# Patient Record
Sex: Male | Born: 1955 | Race: Black or African American | Hispanic: No | Marital: Married | Smoking: Current every day smoker
Health system: Southern US, Community
[De-identification: ages and names within clinical notes are randomized; demographics above are authoritative.]

## PROBLEM LIST (undated history)

## (undated) DIAGNOSIS — I219 Acute myocardial infarction, unspecified: Secondary | ICD-10-CM

## (undated) DIAGNOSIS — I10 Essential (primary) hypertension: Secondary | ICD-10-CM

## (undated) DIAGNOSIS — I2510 Atherosclerotic heart disease of native coronary artery without angina pectoris: Secondary | ICD-10-CM

## (undated) HISTORY — PX: CORONARY ANGIOPLASTY WITH STENT PLACEMENT: SHX49

---

## 2001-12-28 ENCOUNTER — Ambulatory Visit (HOSPITAL_COMMUNITY): Admission: RE | Admit: 2001-12-28 | Discharge: 2001-12-28 | Payer: Self-pay | Admitting: *Deleted

## 2001-12-28 ENCOUNTER — Encounter: Payer: Self-pay | Admitting: Family Medicine

## 2005-09-19 ENCOUNTER — Ambulatory Visit (HOSPITAL_COMMUNITY): Admission: RE | Admit: 2005-09-19 | Discharge: 2005-09-19 | Payer: Self-pay | Admitting: Family Medicine

## 2007-06-24 ENCOUNTER — Inpatient Hospital Stay (HOSPITAL_COMMUNITY): Admission: EM | Admit: 2007-06-24 | Discharge: 2007-06-28 | Payer: Self-pay | Admitting: Emergency Medicine

## 2007-07-20 ENCOUNTER — Encounter (HOSPITAL_COMMUNITY): Admission: RE | Admit: 2007-07-20 | Discharge: 2007-08-03 | Payer: Self-pay | Admitting: Cardiology

## 2007-08-06 ENCOUNTER — Encounter (HOSPITAL_COMMUNITY): Admission: RE | Admit: 2007-08-06 | Discharge: 2007-09-05 | Payer: Self-pay | Admitting: Cardiology

## 2009-09-28 ENCOUNTER — Encounter (HOSPITAL_COMMUNITY): Admission: RE | Admit: 2009-09-28 | Discharge: 2009-11-02 | Payer: Self-pay | Admitting: Cardiology

## 2009-12-04 ENCOUNTER — Ambulatory Visit (HOSPITAL_COMMUNITY): Admission: RE | Admit: 2009-12-04 | Discharge: 2009-12-04 | Payer: Self-pay | Admitting: Family Medicine

## 2010-11-24 ENCOUNTER — Encounter: Payer: Self-pay | Admitting: Family Medicine

## 2011-01-10 ENCOUNTER — Emergency Department (HOSPITAL_COMMUNITY): Payer: Managed Care, Other (non HMO)

## 2011-01-10 ENCOUNTER — Emergency Department (HOSPITAL_COMMUNITY)
Admission: EM | Admit: 2011-01-10 | Discharge: 2011-01-11 | Disposition: A | Discharge status: Other Acute Inpatient Hospital | Payer: Managed Care, Other (non HMO) | Attending: Emergency Medicine | Admitting: Emergency Medicine

## 2011-01-10 DIAGNOSIS — I200 Unstable angina: Secondary | ICD-10-CM | POA: Insufficient documentation

## 2011-01-10 DIAGNOSIS — R0602 Shortness of breath: Secondary | ICD-10-CM | POA: Insufficient documentation

## 2011-01-10 DIAGNOSIS — I10 Essential (primary) hypertension: Secondary | ICD-10-CM | POA: Insufficient documentation

## 2011-01-10 DIAGNOSIS — R209 Unspecified disturbances of skin sensation: Secondary | ICD-10-CM | POA: Insufficient documentation

## 2011-01-10 DIAGNOSIS — R079 Chest pain, unspecified: Secondary | ICD-10-CM | POA: Insufficient documentation

## 2011-01-10 LAB — BASIC METABOLIC PANEL
GFR calc non Af Amer: >60 mL/min (ref >60)
Glucose, Bld: 194 mg/dL — ABNORMAL HIGH (ref 70–99)
Potassium: 3.6 mEq/L (ref 3.5–5.1)
Sodium: 132 mEq/L — ABNORMAL LOW (ref 135–145)

## 2011-01-10 LAB — DIFFERENTIAL
Basophils Absolute: 0 10*3/uL (ref 0.0–0.1)
Lymphocytes Relative: 42 % (ref 12–46)
Neutro Abs: 5 10*3/uL (ref 1.7–7.7)
Neutrophils Relative %: 47 % (ref 43–77)

## 2011-01-10 LAB — CBC
HCT: 43.1 % (ref 39.0–52.0)
Hemoglobin: 14.4 g/dL (ref 13.0–17.0)
RDW: 14.8 % (ref 11.5–15.5)
WBC: 10.8 10*3/uL — ABNORMAL HIGH (ref 4.0–10.5)

## 2011-01-10 LAB — POCT CARDIAC MARKERS
CKMB, poc: 1.6 ng/mL (ref 1.0–8.0)
Troponin i, poc: <0.05 ng/mL (ref 0.00–0.09)

## 2011-01-11 ENCOUNTER — Inpatient Hospital Stay (HOSPITAL_COMMUNITY)
Admission: EM | Admit: 2011-01-11 | Discharge: 2011-01-14 | DRG: 247 | Disposition: A | Discharge status: Home / Self Care | Payer: Managed Care, Other (non HMO) | Source: Other Acute Inpatient Hospital | Attending: Cardiology | Admitting: Cardiology

## 2011-01-11 DIAGNOSIS — Z7982 Long term (current) use of aspirin: Secondary | ICD-10-CM

## 2011-01-11 DIAGNOSIS — E785 Hyperlipidemia, unspecified: Secondary | ICD-10-CM | POA: Diagnosis present

## 2011-01-11 DIAGNOSIS — Z87891 Personal history of nicotine dependence: Secondary | ICD-10-CM

## 2011-01-11 DIAGNOSIS — I10 Essential (primary) hypertension: Secondary | ICD-10-CM | POA: Diagnosis present

## 2011-01-11 DIAGNOSIS — E119 Type 2 diabetes mellitus without complications: Secondary | ICD-10-CM | POA: Diagnosis present

## 2011-01-11 DIAGNOSIS — E7800 Pure hypercholesterolemia, unspecified: Secondary | ICD-10-CM | POA: Diagnosis present

## 2011-01-11 DIAGNOSIS — I200 Unstable angina: Secondary | ICD-10-CM | POA: Diagnosis present

## 2011-01-11 DIAGNOSIS — I2510 Atherosclerotic heart disease of native coronary artery without angina pectoris: Principal | ICD-10-CM | POA: Diagnosis present

## 2011-01-11 DIAGNOSIS — I252 Old myocardial infarction: Secondary | ICD-10-CM

## 2011-01-11 DIAGNOSIS — Z9861 Coronary angioplasty status: Secondary | ICD-10-CM

## 2011-01-11 LAB — LIPID PANEL
Cholesterol: 186 mg/dL (ref 0–200)
HDL: 33 mg/dL — ABNORMAL LOW (ref >39)
LDL Cholesterol: 129 mg/dL — ABNORMAL HIGH (ref 0–99)
Total CHOL/HDL Ratio: 5.6 RATIO
Triglycerides: 119 mg/dL (ref <150)

## 2011-01-11 LAB — CARDIAC PANEL(CRET KIN+CKTOT+MB+TROPI)
CK, MB: 3.8 ng/mL (ref 0.3–4.0)
Relative Index: 1 (ref 0.0–2.5)
Total CK: 299 U/L — ABNORMAL HIGH (ref 7–232)
Total CK: 302 U/L — ABNORMAL HIGH (ref 7–232)
Troponin I: 0.06 ng/mL (ref 0.00–0.06)
Troponin I: 0.1 ng/mL — ABNORMAL HIGH (ref 0.00–0.06)

## 2011-01-11 LAB — GLUCOSE, CAPILLARY: Glucose-Capillary: 170 mg/dL — ABNORMAL HIGH (ref 70–99)

## 2011-01-11 LAB — HEPARIN LEVEL (UNFRACTIONATED): Heparin Unfractionated: 0.58 IU/mL (ref 0.30–0.70)

## 2011-01-12 ENCOUNTER — Inpatient Hospital Stay (HOSPITAL_COMMUNITY): Payer: Managed Care, Other (non HMO)

## 2011-01-12 LAB — CBC
HCT: 40.3 % (ref 39.0–52.0)
Hemoglobin: 13.8 g/dL (ref 13.0–17.0)
MCV: 81.4 fL (ref 78.0–100.0)
RBC: 4.95 MIL/uL (ref 4.22–5.81)
WBC: 10.4 10*3/uL (ref 4.0–10.5)

## 2011-01-12 LAB — GLUCOSE, CAPILLARY: Glucose-Capillary: 166 mg/dL — ABNORMAL HIGH (ref 70–99)

## 2011-01-12 MED ORDER — TECHNETIUM TC 99M TETROFOSMIN IV KIT
30.0000 | PACK | Freq: Once | INTRAVENOUS | Status: AC | PRN
  Administered 2011-01-12: 30 via INTRAVENOUS

## 2011-01-12 MED ORDER — TECHNETIUM TC 99M TETROFOSMIN IV KIT
10.0000 | PACK | Freq: Once | INTRAVENOUS | Status: AC | PRN
  Administered 2011-01-12: 10 via INTRAVENOUS

## 2011-01-13 ENCOUNTER — Other Ambulatory Visit (HOSPITAL_COMMUNITY): Payer: Self-pay

## 2011-01-13 LAB — PLATELET INHIBITION P2Y12
P2Y12 % Inhibition: 6 %
Platelet Function  P2Y12: 281 [PRU] (ref 194–418)
Platelet Function Baseline: 299 [PRU] (ref 194–418)

## 2011-01-13 LAB — CBC
HCT: 40.7 % (ref 39.0–52.0)
Hemoglobin: 13.6 g/dL (ref 13.0–17.0)
MCH: 27.3 pg (ref 26.0–34.0)
MCHC: 33.4 g/dL (ref 30.0–36.0)
MCV: 81.6 fL (ref 78.0–100.0)
RBC: 4.99 MIL/uL (ref 4.22–5.81)

## 2011-01-13 LAB — GLUCOSE, CAPILLARY
Glucose-Capillary: 136 mg/dL — ABNORMAL HIGH (ref 70–99)
Glucose-Capillary: 121 mg/dL — ABNORMAL HIGH (ref 70–99)
Glucose-Capillary: 126 mg/dL — ABNORMAL HIGH (ref 70–99)
Glucose-Capillary: 178 mg/dL — ABNORMAL HIGH (ref 70–99)

## 2011-01-14 LAB — CBC
Hemoglobin: 12.4 g/dL — ABNORMAL LOW (ref 13.0–17.0)
MCH: 26.6 pg (ref 26.0–34.0)
Platelets: 242 10*3/uL (ref 150–400)
RBC: 4.67 MIL/uL (ref 4.22–5.81)
WBC: 8.7 10*3/uL (ref 4.0–10.5)

## 2011-01-14 LAB — GLUCOSE, CAPILLARY: Glucose-Capillary: 118 mg/dL — ABNORMAL HIGH (ref 70–99)

## 2011-01-14 LAB — CK TOTAL AND CKMB (NOT AT ARMC)
CK, MB: 3.2 ng/mL (ref 0.3–4.0)
Relative Index: 1 (ref 0.0–2.5)
Total CK: 314 U/L — ABNORMAL HIGH (ref 7–232)

## 2011-01-14 LAB — BASIC METABOLIC PANEL
Calcium: 8.7 mg/dL (ref 8.4–10.5)
Chloride: 106 mEq/L (ref 96–112)
Creatinine, Ser: 0.99 mg/dL (ref 0.4–1.5)
GFR calc Af Amer: >60 mL/min (ref >60)
Sodium: 134 mEq/L — ABNORMAL LOW (ref 135–145)

## 2011-02-06 NOTE — Discharge Summary (Signed)
[name], [name]              ACCOUNT NO.:  [id number]  MEDICAL RECORD NO.:  [id number]           PATIENT TYPE:  I  LOCATION:  6526                         FACILITY:  [facility]  PHYSICIAN:  [name], M.D. DATE OF BIRTH:  July 14, 1956  DATE OF ADMISSION:  01/11/2011 DATE OF DISCHARGE:  01/14/2011                              DISCHARGE SUMMARY   ADMITTING DIAGNOSES:  Unstable angina, rule out myocardial infarction, coronary artery disease, history of high lateral wall myocardial infarction in the past, hypertension, diabetes mellitus.  DISCHARGE DIAGNOSES:  Status post unstable angina, status post PTCA stenting to posterolateral branch of RCA, coronary artery disease, history of high lateral wall myocardial infarction in the past, hypertension, hypercholesteremia, non-insulin-dependent diabetes mellitus, history of tobacco abuse.  DISCHARGE MEDICATIONS: 1. Enteric-coated aspirin 325 mg 1 tablet daily. 2. Prasugrel 10 mg 1 tablet daily. 3. Crestor 20 mg 1 tablet daily. 4. Metoprolol XL 50 mg 1 tablet every morning. 5. Exforge 10/320 mg 1 tablet daily. 6. Tekturna HCT 1 tablet daily. 7. Metformin XR 500 mg 1 tablet twice daily starting from January 15, 2011. 8. Amaryl 4 mg 1 tablet twice daily. 9. Nitrostat 0.4 mg sublingual q. 5 minutes as needed.  DIET:  Low salt, low cholesterol, 1800 calories ADA diet.  The patient has been advised to reduce weight.  The patient also has been advised to refrain from smoking.  Post PTCA stent instructions have been given.  The patient will be scheduled for phase II cardiac rehab as outpatient.  CONDITION AT DISCHARGE:  Stable.  BRIEF HISTORY AND HOSPITAL COURSE:  [name] is a 55 year old black male with past medical history significant for coronary artery disease, history of high lateral wall MI in 2008, subsequently had PTCA stenting to diagonal 2, hypertension, non-insulin-dependent diabetes mellitus, hypercholesteremia,  morbid obesity, tobacco abuse was admitted by Dr. Algie [name] on January 11, 2011, because of recurrent chest pain.  The patient initially went to [facility], had negative cardiac workup, but because of recurrent chest pain, was referred to [facility] for further evaluation and treatment.  PAST MEDICAL HISTORY:  As above.  MEDICATION AT HOME:  He is on aspirin, Plavix, Lopressor, Lipitor, Norvasc, Nitrostat.  SOCIAL HISTORY:  He is married, has four children.  Smoked one-pack per day for 30+ years.  No history of alcohol abuse.  He lives with oldest daughter.  ALLERGIES:  No known drug allergies.  FAMILY HISTORY:  Positive for coronary artery disease.  PHYSICAL EXAMINATION:  GENERAL:  He is alert, awake, and oriented x3, in no acute distress. VITAL SIGNS:  Blood pressure was 142/83, pulse was 87 and regular. HEENT:  Conjunctivae were pink. NECK:  Supple.  No JVD. LUNGS:  Clear to auscultation without rhonchi or rales. CARDIOVASCULAR:  S1 and S2 were normal. EXTREMITIES:  There is no clubbing, cyanosis, or edema. NEURO:  Grossly intact.  LABORATORY DATA:  His three sets of cardiac enzymes were negative. Hemoglobin was 14.4, hematocrit 43.1, white count of 10.8.  His cholesterol was 186, LDL was 129, HDL 33, triglycerides were 119, hemoglobin A1c was 8.3.  His  platelet P2Y12 percent inhibition was 6%. PRU value was also elevated to 281.  His Plavix has been changed to Effient.  The patient did get bolus of 60 mg today and will be started on 10 mg every day.  Post PCI, his CK is 314, MB of 3.2, relative index of 1.0.  Today's hemoglobin is 12.4, hematocrit 37.7, white count of 8.7, platelet count is 242,000.  His sodium was 4.2, BUN 13, creatinine 0.99, glucose is 113.  BRIEF HOSPITAL COURSE:  The patient was admitted to telemetry unit.  MI was ruled out by serial enzymes and EKG.  The patient subsequently underwent Persantine Myoview, which showed reversible ischemia in  the apical lateral wall with EF of 52%.  The patient subsequently underwent left cath and PTCA stenting to posterolateral branch of RCA.  As per procedure report, the patient tolerated procedure well.  There were no complications.  Postprocedure, the patient did not have any episodes of chest pain during the hospital stay.  His groin is stable with no evidence of hematoma or bruit.  Phase I cardiac rehab was called.  The patient ambulated earlier this morning without any problems.  His groin is stable.  The patient will be discharged home on above medications, and will be followed up in my office in 1 week.  The patient will be scheduled for phase II cardiac rehab as outpatient.     [name]. [name], M.D.     MNH/MEDQ  D:  01/14/2011  T:  01/15/2011  Job:  [id number]  Electronically Signed by [name] M.D. on 02/06/2011 10:29:44 PM

## 2011-02-06 NOTE — Procedures (Signed)
NAMEJERELD, [name]              ACCOUNT NO.:  [id number]  MEDICAL RECORD NO.:  [id number]           PATIENT TYPE:  I  LOCATION:  6526                         FACILITY:  [facility]  PHYSICIAN:  Raneem Mendolia N. Sharyn Lull, M.D. DATE OF BIRTH:  [birth year]  DATE OF PROCEDURE:  01/13/2011 DATE OF DISCHARGE:  01/14/2011                           CARDIAC CATHETERIZATION   SURGEON:  [name]. Sharyn Lull, MD  PROCEDURES: 1. Left cardiac catheterization with selective left and right coronary     angiography, left ventriculography via right groin using Judkins     technique. 2. Successful percutaneous transluminal coronary angioplasty to     posterior left ventricular branch using 2.5 x 12 mm long Trek     balloon. 3. Successful deployment of 2.5 x 16 mm long Promus Element drug-     eluting stent in posterior left ventricular branch of right     coronary artery. 4. Successful postdilatation of this stent using 2.75 x 12 mm long Halbur     Trek balloon.  INDICATIONS FOR PROCEDURE:  [name] is a 55 year old black male with past medical history significant for coronary artery disease, history of high lateral wall MI in the past, hypertension, GERD, degenerative joint disease, and tobacco abuse who was admitted by Dr. Algie Coffer on January 11, 2011, because of recurrent chest pain.  The patient initially went to [facility], had negative cardiac workup there, and due to recurrent chest pain was transferred to [facility] for further evaluation and treatment.  The patient was admitted to Telemetry Unit. MI was ruled out by serial enzymes and EKG.  The patient subsequently underwent Persantine Myoview which showed reversible defect involving the apical lateral wall with EF of 52%.  Due to typical anginal chest pain and positive Persantine Myoview and multiple risk factors, I discussed with the patient regarding left cath, possible PTCA and stenting, its risks and benefits, i.e., death, MI, stroke,  need for emergency CABG, local vascular complications, risk of restenosis, etc., and consented for the procedure.  PROCEDURE:  After obtaining informed consent, the patient was brought to the Cath Lab and was placed on fluoroscopy table.  Right groin was prepped and draped in the usual fashion.  Xylocaine 1% was used for local anesthesia in the right groin.  With the help of thin-wall needle, a 6-French arterial sheath was placed.  The sheath was aspirated and flushed.  Next, a 6-French left Judkins catheter was advanced over the wire under fluoroscopic guidance up to the ascending aorta.  Wire was pulled out.  The catheter was aspirated and connected to the manifold. Catheter was further advanced and engaged into left coronary ostium. Multiple views of the left system were taken.  Next, the catheter was disengaged and was pulled out over the wire and was replaced with 6- [place] right Judkins catheter which was advanced over the wire under fluoroscopic guidance up to the ascending aorta.  Wire was pulled out. The catheter was aspirated and connected to the manifold.  Catheter was further advanced and engaged into right coronary ostium.  Multiple views of the right system were taken.  Next, the  catheter was disengaged and was pulled out over the wire and was replaced with 6-French pigtail catheter which was advanced over the wire under fluoroscopic guidance up to the ascending aorta.  Wire was pulled out.  The catheter was aspirated and connected to the manifold.  Catheter was further advanced across the aortic valve into the LV.  LV pressures were recorded.  Next, LV-graphy was done in 30-degree RAO position.  Post-angiographic pressures were recorded from LV and then pullback pressures were recorded from aorta.  There was no gradient across the aortic valve. Next, pigtail catheter was pulled out over the wire.  Sheaths were aspirated and flushed.  FINDINGS:  LV showed mild  inferoapical wall hypokinesia, LVH.  There was 3+ MR, EF of 50% approximately.  Left main was patent.  LAD has 10-15% ostial stenosis.  Diagonal 1 is very small.  Diagonal 2 has 50-60% ostial stenosis.  Stented proximal portion is patent.  Left circumflex has 20-25% proximal stenosis.  OM-1 is small which is long.  OM-2 and OM- 3 were very, very small.  OM-4 was small which was patent.  RCA has 20- 25% proximal sequential stenosis and 50-60% mid focal stenosis.  PDA is patent.  PLV branch has focal mid 90-95% stenosis.  INTERVENTIONAL PROCEDURE:  Successful PTCA to PLV branch of RCA was done using 2.5 x 12 mm long Trek balloon for predilatation and then 2.5 x 16 mm long Promus Element drug-eluting stent was deployed at 13 atmospheric pressure in PLV branch.  Stent was postdilated using 2.75 x 12 mm long Henry Trek balloon going up to 18 atmospheric pressure.  Lesion was dilated from 90-95% to 0% residual with excellent TIMI grade 3 distal flow without evidence of dissection or distal embolization.  The patient received weight-based Angiomax and 600 mg of Plavix during the procedure.  The patient tolerated the procedure well.  There were no complications.  The patient was transferred to the recovery room in stable condition.     [name]. Sharyn Lull, M.D.     MNH/MEDQ  D:  01/14/2011  T:  01/15/2011  Job:  [id number]  Electronically Signed by Rinaldo Cloud M.D. on 02/06/2011 10:29:49 PM

## 2011-03-18 NOTE — Cardiovascular Report (Signed)
[name]              ACCOUNT NO.:  [id number]   MEDICAL RECORD NO.:  [id number]          PATIENT TYPE:  INP   LOCATION:  2905                         FACILITY:  [facility]   PHYSICIAN:  [name]. Sharyn Lull, M.D. DATE OF BIRTH:  1956-05-23   DATE OF PROCEDURE:  06/24/2007  DATE OF DISCHARGE:                            CARDIAC CATHETERIZATION   PROCEDURES:  1. Left cardiac catheterization with selective left and right coronary      angiography, left ventriculography via right groin using Judkins      technique.  2. Successful percutaneous transluminal coronary angiography to      proximal 100% occluded diagonal-2 using 2.5 mg to point using 1.58      x 12-mm long, Voyager balloon and 2.5 x 8-mm long, Voyager balloon.  3. Successful deployment of 2.5 x 18-mm long, Cypher drug-eluting      stent.  4. Successful post dilatation of Cypher drug-eluting stent using 2.75      x 8-mm long PowerSail balloon.   INDICATIONS FOR PROCEDURE:  [name] is a 55 year old, black male with  past medical history significant for hypertension, GERD, degenerative  joint disease, tobacco abuse and complains of recurrent retrosternal  chest pain tightness since Sunday off and on lasting 2-3 hours.  Did not  seek any medical attention as he thought this was due to acid reflux  until Wednesday when he saw his PMD when he had vague abdominal pain and  was treated for GERD.  He was also noted to have elevated blood pressure  and was referred to Dr. Magda Kiel office for elevated blood pressure and  vague abdominal pain.  EKG done at Dr. Magda Kiel office showed Q waves  in I and aVL with ST elevation and focal ST depression in inferior and  lateral leads and also Q-wave in septal leads.  The patient refused to  come to the ER and as he has to go back to school and then drove back to  the ER later in the evening complaining of feeling weak and short of  breath and vague upper abdominal pain.  EKG done in  the ER showed  similar changes as above, but minimal improvement in R-wave progression  with normalization of R-wave progression in V1-V3.  Due to typical EKG  changes, vague upper abdominal and chest pain, the patient was also  noted to have elevated CPK-MB and troponin I suggestive of recent high  lateral MI.  Discussed with the patient at length regarding emergency  left catheterization with possible PTCA stenting, its risks and  benefits, i.e. death, MI, stroke, need for emergency CABG, risk of  restenosis, local vascular complications, accepted and consented for the  procedure.   PROCEDURE:  After obtaining the informed consent, the patient was  brought to the catheterization lab and was placed on fluoroscopy table.  The right groin was prepped and draped in usual fashion.  Xylocaine 2%  was used for local anesthesia in the right groin.  With the help of thin-  wall needle, a 6-French arterial sheath was placed.  The sheath was  aspirated and flushed.  Next, 6-French left Judkins catheter was  advanced over the wire under fluoroscopic guidance up to the ascending  aorta.  Wire was pulled out.  The catheter was aspirated and connected  to the manifold.  Catheter was further advanced and engaged into the  left coronary ostium.  Multiple views of the left system were taken.  The catheter was disengaged and was pulled out over the wire.  It was  replaced with a 6-French, right Judkins catheter which was advanced over  the wire under fluoroscopic guidance up to the ascending aorta.  Wire  was pulled out, the catheter was aspirated and connected to the  manifold.  Catheter was further advanced and engaged into right coronary  ostium.  Multiple views of the right system were taken.  Next, the  catheter was disengaged and was pulled out over the wire and was  replaced with 6-French pigtail catheter which was advanced over the wire  under fluoroscopic guidance up to the ascending aorta.  The  wire was  pulled out,  the catheter was aspirated and connected to the manifold.  Catheter was further advanced across the aortic valve into the LV.  LV  pressures were recorded.  Next, LV graft was done in 30-degree RAO  position.  Post angiographic pressures were recorded from LV and then  pullback pressures were recorded from the aorta.  There was no gradient  across the aortic valve.  Next, the pigtail catheter was pulled out over  the wire.  Sheaths were aspirated and flushed.   FINDINGS:  LV showed anterolateral wall hypokinesia, EF of 45-50%.  Left  main was patent.  LAD has 15-20% proximal and distal stenosis.  Diagonal-  1 is small which is patent.  Diagonal-2 was 100% occluded at the ostium.  Left circumflex at 10-15% mid stenosis.  OM-1 and OM-2 were mild at 10-  15% stenosis.  RCA was large which has 10-15% mid stenosis.  PDA was  patent.  PLV branches were patent.   Interventional procedure with successful PTCA to proximal 100% occluded  diagonal-2 was done using 2.5 x 8-mm long, Voyager balloon and then 1.5  x 12-mm long, Voyager balloon for predilatation and then 2.5 x 18-mm  long, Cypher drug-eluting stent was deployed in proximal diagonal-2 at  15 atmospheres of pressure.  Stent was postdilated using 2.75 x 8-mm  long, PowerSail balloon going up to 18 atmospheres pressure.  Lesion was  dilated from 100% to 0% with excellent TIMI-3 distal flow without  evidence of dissection or distal embolization.  The patient received  weight-based Angiomax and 600 mg of Plavix during the procedure.  The  patient tolerated procedure well.  There are no complications.  The  patient was transferred to recovery room.  The patient was transferred  to CCU in stable condition.      [name]. Sharyn Lull, M.D.  Electronically Signed     MNH/MEDQ  D:  06/25/2007  T:  06/26/2007  Job:  [id number]   cc:   Osvaldo Shipper. Spruill, M.D.  Catheterization Lab

## 2011-03-18 NOTE — Discharge Summary (Signed)
[name], [name] NO.:  [id number]   MEDICAL RECORD NO.:  [id number]          PATIENT TYPE:  INP   LOCATION:  2031                         FACILITY:  [facility]   PHYSICIAN:  [name], M.D. DATE OF BIRTH:  August 25, 1956   DATE OF ADMISSION:  06/24/2007  DATE OF DISCHARGE:  06/28/2007                               DISCHARGE SUMMARY   ADMISSION DIAGNOSES:  1. Recent high lateral acute myocardial infarction.  2. Post infarct atypical chest pain.  3. Uncontrolled hypertension.  4. Tobacco abuse.  5. Gastroesophageal reflux disease.  6. Degenerative joint disease.  7. Positive family history of coronary artery disease.   DISCHARGE DIAGNOSIS:  1. Status post recent acute high lateral myocardial infarction; status      post  infarct angina; status post percutaneous coronary      intervention to 100% occluded diagonal #2.  2. Hypertension.  3. Hypercholesterolemia.  4. History of tobacco abuse.  5. Gastroesophageal reflux disease.  6. Degenerative joint disease.  7. Positive family history of coronary artery disease.  8. Glucose intolerance.   MEDICATIONS ON DISCHARGE:  1. Enteric coated aspirin 325 mg one tablet daily.  2. Plavix 75 mg one tablet daily with food.  3. Lopressor 100 mg one tablet twice daily.  4. Diovan 320 mg one tablet daily.  5. Lotensin 10 mg one tablet daily.  6. Lipitor 80 mg one tablet daily.  7. Norvasc 5 mg one tablet twice daily.  8. Nitrostat 0.4 mg sublingual __________ .   DISCHARGE DIET:  Low salt, low cholesterol.   DISCHARGE ACTIVITIES:  Increase activities slowly.  Post PTCA and stent  instructions have been given.  Follow up with me in one week; with Dr.  Shana [name] and Dr. Lowella [name] as scheduled.   CONDITION ON DISCHARGE:  Stable.   BRIEF HISTORY:  [name] is a 54 year old black male with a past  medical history significant for hypertension; GERD; tobacco abuse.  He  complained of recurrent retrosternal chest pressure  and  tightness since  Sunday off-and-on, lasting 2-3 hours, __________ when he saw his PMD for  __________ abdominal pain and was treated for GERD and __________ .  EKG  done at [name] office revealed __________ leads I and aVL with ST  elevation __________ depression in the inferolateral leads and Q in the  __________ leads.  At first he refused to go the ER, as he had to go to  school __________ he came to the ER because of feeling weak and short of  breath __________ at that time.  __________  done in the ER showed  __________ with improvement of __________ , but persistent Q in I, aVL  __________ .  Also the patient was noted to have __________ .   PAST MEDICAL HISTORY:  As above.  Plus __________ .  He is on Nexium;  __________ ; aspirin.   SOCIAL HISTORY:  He is married, four children.  He is studying for  __________ .  He smokes one pack per day for 30+ years.  No history of  alcohol abuse.   FAMILY  HISTORY:  Family history is positive for coronary artery disease.   PHYSICAL EXAMINATION:  GENERAL APPEARANCE:  On examination, he was alert  and oriented x3, in no acute distress.  VITAL SIGNS:  Blood pressure was 163/102, pulse was 63.  __________ .  NECK:  Supple, no JVD.  LUNGS:  Lungs were clear to auscultation  __________  CARDIAC:  __________ .   LABORATORY INVESTIGATIONS:  His cholesterol was 221, LDL 172, HDL 29,  triglycerides 102.  His first set of cardiac enzymes:  CK 866, MB 42.5,  relative index 4.9.  The second set CK was 984, MB 62.7, relative index  6.4.  The third set CK was 744, MB 39.1, relative index 5.3.  The fourth  set CK was 468, MB 13, relative index 2.8.  On August 24, CK is 440, MB  5.3, his troponin I was 5.74, 32.22, 11.82, 8.21, 6.64, and today is  3.34.   His other labs:  Sodium was 133, potassium 3.8, chloride 103,  bicarbonate 24, glucose 139, BUN 8, creatinine 1.38.  Repeat  electrolytes on August 24:  Sodium 139, potassium 3.7,  chloride 106,  bicarbonate 27, glucose 108, BUN 12, creatinine 1.31.  Hemoglobin was  15.5, hematocrit 46.5, white count of 14.1.  On August 24, hemoglobin is  12.8, hematocrit 38.8, white count  of 9.8.  Today, hemoglobin is 13.1,  hematocrit 38.6, white count of 8.8 which has been stable.   HOSPITAL COURSE:  The patient was directly taken to the catheterization  lab and underwent emergency left catheterization and PTCA and stenting  to a 100% occluded diagonal #2.  As per procedure report the patient  tolerated the procedure well and there were no complications.  The  patient did not have any episodes of chest pain during the hospital  stay.  His groin is stable with no evidence of hematoma or bruit.  The  patient has been ambulating in the hallway without any problems.  Phase  I cardiac rehab was called.  The patient ambulated in the  hallway without any chest pains.  The patient refused phase II cardiac  rehab.  The patient will be discharged home on the above medications and  will be followed up in my office in one week.  The patient also has been  advised to refrain from sweets.  Will check his hemoglobin A1c as an  outpatient.      [name], M.D.  Electronically Signed     MNH/MEDQ  D:  06/28/2007  T:  06/28/2007  Job:  [id number]   cc:   [name], M.D.  Luis Medal, MD

## 2011-08-15 LAB — URINE MICROSCOPIC-ADD ON

## 2011-08-15 LAB — DIFFERENTIAL
Eosinophils Relative: 1
Lymphocytes Relative: 25
Lymphs Abs: 3.5 — ABNORMAL HIGH
Monocytes Absolute: 1.2 — ABNORMAL HIGH
Monocytes Relative: 8

## 2011-08-15 LAB — CBC
HCT: 38.8 — ABNORMAL LOW
HCT: 39
HCT: 43.3
HCT: 46.5
Hemoglobin: 14.6
MCHC: 33
MCHC: 33.2
MCHC: 33.6
MCHC: 34
MCV: 83.7
MCV: 85.8
MCV: 86.6
Platelets: 231
Platelets: 243
Platelets: 254
Platelets: 268
Platelets: 271
RBC: 5.48
RDW: 13.9
RDW: 14.5 — ABNORMAL HIGH
WBC: 12.3 — ABNORMAL HIGH
WBC: 14.1 — ABNORMAL HIGH
WBC: 8.8
WBC: 9.8

## 2011-08-15 LAB — COMPREHENSIVE METABOLIC PANEL
AST: 53 — ABNORMAL HIGH
Albumin: 4.1
Calcium: 9.3
Creatinine, Ser: 1.38
GFR calc Af Amer: >60
Sodium: 133 — ABNORMAL LOW

## 2011-08-15 LAB — CK TOTAL AND CKMB (NOT AT ARMC)
CK, MB: 4.1 — ABNORMAL HIGH
Relative Index: 4.9 — ABNORMAL HIGH
Total CK: 450 — ABNORMAL HIGH
Total CK: 866 — ABNORMAL HIGH

## 2011-08-15 LAB — URINALYSIS, ROUTINE W REFLEX MICROSCOPIC
Glucose, UA: NEGATIVE
Ketones, ur: NEGATIVE
Protein, ur: NEGATIVE
pH: 6

## 2011-08-15 LAB — T4: T4, Total: 8.6

## 2011-08-15 LAB — LIPID PANEL
Cholesterol: 221 — ABNORMAL HIGH
LDL Cholesterol: 172 — ABNORMAL HIGH
Triglycerides: 96
VLDL: 19
VLDL: 20

## 2011-08-15 LAB — BASIC METABOLIC PANEL
BUN: 11
BUN: 11
BUN: 12
BUN: 8
CO2: 26
Calcium: 8.9
Calcium: 9
Calcium: 9.1
Calcium: 9.1
Chloride: 105
Chloride: 106
Creatinine, Ser: 1.07
Creatinine, Ser: 1.27
Creatinine, Ser: 1.31
GFR calc Af Amer: >60
GFR calc Af Amer: >60
GFR calc non Af Amer: >60
GFR calc non Af Amer: >60
Glucose, Bld: 112 — ABNORMAL HIGH
Potassium: 4.3
Sodium: 132 — ABNORMAL LOW

## 2011-08-15 LAB — CARDIAC PANEL(CRET KIN+CKTOT+MB+TROPI)
CK, MB: 39.1 — ABNORMAL HIGH
Relative Index: 5.3 — ABNORMAL HIGH
Relative Index: 6.4 — ABNORMAL HIGH
Total CK: 440 — ABNORMAL HIGH
Total CK: 468 — ABNORMAL HIGH
Total CK: 744 — ABNORMAL HIGH
Troponin I: 11.82
Troponin I: 32.22
Troponin I: 6.64
Troponin I: 8.21

## 2011-08-15 LAB — TROPONIN I: Troponin I: 5.74

## 2011-08-15 LAB — TSH: TSH: 1.374

## 2011-12-07 ENCOUNTER — Other Ambulatory Visit: Payer: Self-pay | Admitting: Cardiology

## 2012-04-01 ENCOUNTER — Emergency Department (HOSPITAL_COMMUNITY)
Admission: EM | Admit: 2012-04-01 | Discharge: 2012-04-01 | Disposition: A | Discharge status: Home / Self Care | Payer: Managed Care, Other (non HMO) | Attending: Emergency Medicine | Admitting: Emergency Medicine

## 2012-04-01 ENCOUNTER — Encounter (HOSPITAL_COMMUNITY): Payer: Self-pay | Admitting: *Deleted

## 2012-04-01 ENCOUNTER — Other Ambulatory Visit (HOSPITAL_COMMUNITY): Payer: Self-pay | Admitting: Pharmacy Technician

## 2012-04-01 ENCOUNTER — Emergency Department (HOSPITAL_COMMUNITY): Payer: Managed Care, Other (non HMO)

## 2012-04-01 DIAGNOSIS — W1789XA Other fall from one level to another, initial encounter: Secondary | ICD-10-CM | POA: Insufficient documentation

## 2012-04-01 DIAGNOSIS — W19XXXA Unspecified fall, initial encounter: Secondary | ICD-10-CM

## 2012-04-01 DIAGNOSIS — Y92009 Unspecified place in unspecified non-institutional (private) residence as the place of occurrence of the external cause: Secondary | ICD-10-CM | POA: Insufficient documentation

## 2012-04-01 DIAGNOSIS — S6390XA Sprain of unspecified part of unspecified wrist and hand, initial encounter: Secondary | ICD-10-CM | POA: Insufficient documentation

## 2012-04-01 DIAGNOSIS — I10 Essential (primary) hypertension: Secondary | ICD-10-CM | POA: Insufficient documentation

## 2012-04-01 DIAGNOSIS — S63602A Unspecified sprain of left thumb, initial encounter: Secondary | ICD-10-CM

## 2012-04-01 DIAGNOSIS — I2510 Atherosclerotic heart disease of native coronary artery without angina pectoris: Secondary | ICD-10-CM | POA: Insufficient documentation

## 2012-04-01 DIAGNOSIS — S93401A Sprain of unspecified ligament of right ankle, initial encounter: Secondary | ICD-10-CM

## 2012-04-01 DIAGNOSIS — S93409A Sprain of unspecified ligament of unspecified ankle, initial encounter: Secondary | ICD-10-CM | POA: Insufficient documentation

## 2012-04-01 DIAGNOSIS — E119 Type 2 diabetes mellitus without complications: Secondary | ICD-10-CM | POA: Insufficient documentation

## 2012-04-01 HISTORY — DX: Atherosclerotic heart disease of native coronary artery without angina pectoris: I25.10

## 2012-04-01 HISTORY — DX: Essential (primary) hypertension: I10

## 2012-04-01 NOTE — Discharge Instructions (Signed)
Ankle Sprain An ankle sprain is an injury to the strong, fibrous tissues (ligaments) that hold the bones of your ankle joint together.  CAUSES Ankle sprain usually is caused by a fall or by twisting your ankle. People who participate in sports are more prone to these types of injuries.  SYMPTOMS  Symptoms of ankle sprain include:  Pain in your ankle. The pain may be present at rest or only when you are trying to stand or walk.   Swelling.   Bruising. Bruising may develop immediately or within 1 to 2 days after your injury.   Difficulty standing or walking.  DIAGNOSIS  Your caregiver will ask you details about your injury and perform a physical exam of your ankle to determine if you have an ankle sprain. During the physical exam, your caregiver will press and squeeze specific areas of your foot and ankle. Your caregiver will try to move your ankle in certain ways. An X-ray exam may be done to be sure a bone was not broken or a ligament did not separate from one of the bones in your ankle (avulsion).  TREATMENT  Certain types of braces can help stabilize your ankle. Your caregiver can make a recommendation for this. Your caregiver may recommend the use of medication for pain. If your sprain is severe, your caregiver may refer you to a surgeon who helps to restore function to parts of your skeletal system (orthopedist) or a physical therapist. HOME CARE INSTRUCTIONS  Apply ice to your injury for 1 to 2 days or as directed by your caregiver. Applying ice helps to reduce inflammation and pain.  Put ice in a plastic bag.   Place a towel between your skin and the bag.   Leave the ice on for 15 to 20 minutes at a time, every 2 hours while you are awake.   Take over-the-counter or prescription medicines for pain, discomfort, or fever only as directed by your caregiver.   Keep your injured leg elevated, when possible, to lessen swelling.   If your caregiver recommends crutches, use them as  instructed. Gradually, put weight on the affected ankle. Continue to use crutches or a cane until you can walk without feeling pain in your ankle.   If you have a plaster splint, wear the splint as directed by your caregiver. Do not rest it on anything harder than a pillow the first 24 hours. Do not put weight on it. Do not get it wet. You may take it off to take a shower or bath.   You may have been given an elastic bandage to wear around your ankle to provide support. If the elastic bandage is too tight (you have numbness or tingling in your foot or your foot becomes cold and blue), adjust the bandage to make it comfortable.   If you have an air splint, you may blow more air into it or let air out to make it more comfortable. You may take your splint off at night and before taking a shower or bath.   Wiggle your toes in the splint several times per day if you are able.  SEEK MEDICAL CARE IF:   You have an increase in bruising, swelling, or pain.   Your toes feel cold.   Pain relief is not achieved with medication.  SEEK IMMEDIATE MEDICAL CARE IF: Your toes are numb or blue or you have severe pain. MAKE SURE YOU:   Understand these instructions.   Will watch your condition.     Will get help right away if you are not doing well or get worse.  Document Released: 10/20/2005 Document Revised: 10/09/2011 Document Reviewed: 05/24/2008 Exit[employer] Patient Information 2012 [employer]. 

## 2012-04-01 NOTE — ED Notes (Signed)
Pt states that he fell in a hole and injured his right ankle and left thumb. Still having pain.

## 2012-04-01 NOTE — ED Provider Notes (Signed)
History   This chart was scribed for Geoffery Lyons, MD by Clarita Crane. The patient was seen in room APA12/APA12. Patient's care was started at 1250.    CSN: [id number]  Arrival date & time 04/01/12  1250   First MD Initiated Contact with Patient 04/01/12 1342      Chief Complaint  Patient presents with  . Fall    (Consider location/radiation/quality/duration/timing/severity/associated sxs/prior treatment) HPI [name] is a 56 y.o. male who presents to the Emergency Department complaining of right ankle pain with associated swelling onset 10 days ago after sustaining a fall in which he stepped into a hole while in the yard causing the patient to twist his right ankle and land on his left hand causing pain to left thumb. Patient notes he has been able to ambulate but with noticeable limp since injury was sustained. States pain is aggravated with weight bearing and palpation. Denies numbness, tingling. Patient with h/o diabetes, HTN, CAD.  Past Medical History  Diagnosis Date  . Coronary artery disease   . Diabetes mellitus   . Hypertension     Past Surgical History  Procedure Date  . Coronary angioplasty with stent placement     History reviewed. No pertinent family history.  History  Substance Use Topics  . Smoking status: Current Everyday Smoker -- 2.0 packs/day    Types: Cigarettes  . Smokeless tobacco: Not on file  . Alcohol Use: No     Review of Systems A complete 10 system review of systems was obtained and all systems are negative except as noted in the HPI and PMH.   Allergies  Review of patient's allergies indicates no known allergies.  Home Medications  No current outpatient prescriptions on file.  BP 153/84  Pulse 71  Temp(Src) 98.1 F (36.7 C) (Oral)  Resp 16  Ht 6\' 3"  (1.905 m)  Wt 265 lb (120.203 kg)  BMI 33.12 kg/m2  SpO2 98%  Physical Exam  Nursing note and vitals reviewed. Constitutional: He is oriented to person, place, and time.  He appears well-developed and well-nourished. No distress.  HENT:  Head: Normocephalic and atraumatic.  Eyes: EOM are normal. Pupils are equal, round, and reactive to light.  Neck: Neck supple. No tracheal deviation present.  Cardiovascular: Normal rate and regular rhythm.  Exam reveals no gallop and no friction rub.   No murmur heard. Pulmonary/Chest: Effort normal. No respiratory distress. He has no wheezes. He has no rales.  Abdominal: Soft. He exhibits no distension.  Musculoskeletal: Normal range of motion. He exhibits edema and tenderness.       Significant swelling to right lateral malleolus with tenderness to palpation. Left thumb tender to MCP and IP joints with minimal swelling noted. FROM of left thumb.   Neurological: He is alert and oriented to person, place, and time. No sensory deficit.  Skin: Skin is warm and dry.  Psychiatric: He has a normal mood and affect. His behavior is normal.    ED Course  Procedures (including critical care time)  DIAGNOSTIC STUDIES: Oxygen Saturation is 98% on room air, normal by my interpretation.    COORDINATION OF CARE:    Labs Reviewed  GLUCOSE, CAPILLARY - Abnormal; Notable for the following:    Glucose-Capillary 372 (*)    All other components within normal limits   No results found.   No diagnosis found.    MDM  The xrays look okay.  Will discharge to home with continued rest, time.  To return prn if  worsens.      .Manley Mason, MD 04/01/12 [phone]

## 2013-03-31 ENCOUNTER — Ambulatory Visit: Payer: Managed Care, Other (non HMO) | Admitting: Gastroenterology

## 2015-05-31 ENCOUNTER — Other Ambulatory Visit: Payer: Self-pay | Admitting: Cardiology

## 2015-05-31 DIAGNOSIS — R079 Chest pain, unspecified: Secondary | ICD-10-CM

## 2015-06-04 ENCOUNTER — Encounter (HOSPITAL_COMMUNITY)
Admission: RE | Admit: 2015-06-04 | Discharge: 2015-06-04 | Disposition: A | Discharge status: Home / Self Care | Payer: BLUE CROSS/BLUE SHIELD | Source: Ambulatory Visit | Attending: Cardiology | Admitting: Cardiology

## 2015-06-04 ENCOUNTER — Ambulatory Visit (HOSPITAL_COMMUNITY)
Admission: RE | Admit: 2015-06-04 | Discharge: 2015-06-04 | Disposition: A | Discharge status: Home / Self Care | Payer: BLUE CROSS/BLUE SHIELD | Source: Ambulatory Visit | Attending: Cardiology | Admitting: Cardiology

## 2015-06-04 DIAGNOSIS — R079 Chest pain, unspecified: Secondary | ICD-10-CM

## 2015-06-04 DIAGNOSIS — I2510 Atherosclerotic heart disease of native coronary artery without angina pectoris: Secondary | ICD-10-CM | POA: Insufficient documentation

## 2015-06-04 DIAGNOSIS — R0602 Shortness of breath: Secondary | ICD-10-CM | POA: Insufficient documentation

## 2015-06-04 DIAGNOSIS — Z955 Presence of coronary angioplasty implant and graft: Secondary | ICD-10-CM | POA: Insufficient documentation

## 2015-06-04 LAB — BASIC METABOLIC PANEL
ANION GAP: 7 (ref 5–15)
BUN: <5 mg/dL — ABNORMAL LOW (ref 6–20)
CALCIUM: 9.3 mg/dL (ref 8.9–10.3)
CO2: 26 mmol/L (ref 22–32)
CREATININE: 0.94 mg/dL (ref 0.61–1.24)
Chloride: 104 mmol/L (ref 101–111)
GFR calc Af Amer: >60 mL/min (ref >60)
GFR calc non Af Amer: >60 mL/min (ref >60)
Glucose, Bld: 191 mg/dL — ABNORMAL HIGH (ref 65–99)
POTASSIUM: 3.8 mmol/L (ref 3.5–5.1)
SODIUM: 137 mmol/L (ref 135–145)

## 2015-06-04 LAB — LIPID PANEL
Cholesterol: 209 mg/dL — ABNORMAL HIGH (ref 0–200)
HDL: 32 mg/dL — ABNORMAL LOW (ref >40)
LDL Cholesterol: 148 mg/dL — ABNORMAL HIGH (ref 0–99)
TRIGLYCERIDES: 145 mg/dL (ref <150)
Total CHOL/HDL Ratio: 6.5 RATIO
VLDL: 29 mg/dL (ref 0–40)

## 2015-06-04 LAB — HEPATIC FUNCTION PANEL
ALBUMIN: 3.4 g/dL — AB (ref 3.5–5.0)
ALT: 14 U/L — ABNORMAL LOW (ref 17–63)
AST: 13 U/L — AB (ref 15–41)
Alkaline Phosphatase: 99 U/L (ref 38–126)
BILIRUBIN TOTAL: 0.6 mg/dL (ref 0.3–1.2)
Bilirubin, Direct: <0.1 mg/dL — ABNORMAL LOW (ref 0.1–0.5)
Total Protein: 7 g/dL (ref 6.5–8.1)

## 2015-06-04 MED ORDER — REGADENOSON 0.4 MG/5ML IV SOLN
INTRAVENOUS | Status: AC
  Filled 2015-06-04: qty 5

## 2015-06-04 MED ORDER — REGADENOSON 0.4 MG/5ML IV SOLN
0.4000 mg | Freq: Once | INTRAVENOUS | Status: AC
  Administered 2015-06-04: 0.4 mg via INTRAVENOUS

## 2015-06-04 MED ORDER — TECHNETIUM TC 99M SESTAMIBI GENERIC - CARDIOLITE
30.0000 | Freq: Once | INTRAVENOUS | Status: AC | PRN
  Administered 2015-06-04: 30 via INTRAVENOUS

## 2015-06-04 MED ORDER — TECHNETIUM TC 99M SESTAMIBI GENERIC - CARDIOLITE
10.0000 | Freq: Once | INTRAVENOUS | Status: AC | PRN
  Administered 2015-06-04: 10 via INTRAVENOUS

## 2015-06-05 LAB — HEMOGLOBIN A1C
Hgb A1c MFr Bld: 10.8 % — ABNORMAL HIGH (ref 4.8–5.6)
MEAN PLASMA GLUCOSE: 263 mg/dL

## 2015-08-21 ENCOUNTER — Emergency Department (HOSPITAL_COMMUNITY): Payer: BLUE CROSS/BLUE SHIELD

## 2015-08-21 ENCOUNTER — Observation Stay (HOSPITAL_COMMUNITY)
Admission: EM | Admit: 2015-08-21 | Discharge: 2015-08-23 | Disposition: A | Discharge status: Home / Self Care | Payer: BLUE CROSS/BLUE SHIELD | Attending: Internal Medicine | Admitting: Internal Medicine

## 2015-08-21 ENCOUNTER — Encounter (HOSPITAL_COMMUNITY): Payer: Self-pay | Admitting: *Deleted

## 2015-08-21 DIAGNOSIS — R61 Generalized hyperhidrosis: Secondary | ICD-10-CM | POA: Insufficient documentation

## 2015-08-21 DIAGNOSIS — Z9861 Coronary angioplasty status: Secondary | ICD-10-CM | POA: Insufficient documentation

## 2015-08-21 DIAGNOSIS — R5383 Other fatigue: Secondary | ICD-10-CM | POA: Insufficient documentation

## 2015-08-21 DIAGNOSIS — I5020 Unspecified systolic (congestive) heart failure: Secondary | ICD-10-CM | POA: Diagnosis not present

## 2015-08-21 DIAGNOSIS — E119 Type 2 diabetes mellitus without complications: Secondary | ICD-10-CM | POA: Diagnosis not present

## 2015-08-21 DIAGNOSIS — I052 Rheumatic mitral stenosis with insufficiency: Secondary | ICD-10-CM | POA: Diagnosis not present

## 2015-08-21 DIAGNOSIS — Z79899 Other long term (current) drug therapy: Secondary | ICD-10-CM | POA: Insufficient documentation

## 2015-08-21 DIAGNOSIS — I2510 Atherosclerotic heart disease of native coronary artery without angina pectoris: Secondary | ICD-10-CM | POA: Diagnosis not present

## 2015-08-21 DIAGNOSIS — I10 Essential (primary) hypertension: Secondary | ICD-10-CM | POA: Diagnosis not present

## 2015-08-21 DIAGNOSIS — R0602 Shortness of breath: Secondary | ICD-10-CM | POA: Diagnosis present

## 2015-08-21 DIAGNOSIS — I509 Heart failure, unspecified: Secondary | ICD-10-CM

## 2015-08-21 DIAGNOSIS — Z720 Tobacco use: Secondary | ICD-10-CM | POA: Insufficient documentation

## 2015-08-21 DIAGNOSIS — R05 Cough: Secondary | ICD-10-CM | POA: Diagnosis not present

## 2015-08-21 DIAGNOSIS — Z794 Long term (current) use of insulin: Secondary | ICD-10-CM | POA: Insufficient documentation

## 2015-08-21 HISTORY — DX: Acute myocardial infarction, unspecified: I21.9

## 2015-08-21 LAB — BRAIN NATRIURETIC PEPTIDE: B Natriuretic Peptide: 579 pg/mL — ABNORMAL HIGH (ref 0.0–100.0)

## 2015-08-21 LAB — HEPATIC FUNCTION PANEL
ALT: 15 U/L — ABNORMAL LOW (ref 17–63)
AST: 19 U/L (ref 15–41)
Albumin: 4 g/dL (ref 3.5–5.0)
Alkaline Phosphatase: 83 U/L (ref 38–126)
Bilirubin, Direct: 0.2 mg/dL (ref 0.1–0.5)
Indirect Bilirubin: 0.9 mg/dL (ref 0.3–0.9)
Total Bilirubin: 1.1 mg/dL (ref 0.3–1.2)
Total Protein: 7.7 g/dL (ref 6.5–8.1)

## 2015-08-21 LAB — CBC WITH DIFFERENTIAL/PLATELET
Basophils Absolute: 0.1 10*3/uL (ref 0.0–0.1)
Basophils Relative: 1 %
Eosinophils Absolute: 0.3 10*3/uL (ref 0.0–0.7)
Eosinophils Relative: 3 %
HCT: 43 % (ref 39.0–52.0)
Hemoglobin: 14.7 g/dL (ref 13.0–17.0)
LYMPHS PCT: 36 %
Lymphs Abs: 3 10*3/uL (ref 0.7–4.0)
MCH: 28.5 pg (ref 26.0–34.0)
MCHC: 34.2 g/dL (ref 30.0–36.0)
MCV: 83.3 fL (ref 78.0–100.0)
MONOS PCT: 7 %
Monocytes Absolute: 0.6 10*3/uL (ref 0.1–1.0)
NEUTROS PCT: 53 %
Neutro Abs: 4.6 10*3/uL (ref 1.7–7.7)
PLATELETS: 251 10*3/uL (ref 150–400)
RBC: 5.16 MIL/uL (ref 4.22–5.81)
RDW: 14 % (ref 11.5–15.5)
WBC: 8.5 10*3/uL (ref 4.0–10.5)

## 2015-08-21 LAB — BASIC METABOLIC PANEL
ANION GAP: 8 (ref 5–15)
BUN: 7 mg/dL (ref 6–20)
CO2: 23 mmol/L (ref 22–32)
Calcium: 9.1 mg/dL (ref 8.9–10.3)
Chloride: 103 mmol/L (ref 101–111)
Creatinine, Ser: 1.16 mg/dL (ref 0.61–1.24)
GFR calc non Af Amer: >60 mL/min (ref >60)
Glucose, Bld: 156 mg/dL — ABNORMAL HIGH (ref 65–99)
Potassium: 3.8 mmol/L (ref 3.5–5.1)
SODIUM: 134 mmol/L — AB (ref 135–145)

## 2015-08-21 LAB — I-STAT TROPONIN, ED: TROPONIN I, POC: 0.04 ng/mL (ref 0.00–0.08)

## 2015-08-21 LAB — TROPONIN I: Troponin I: 0.03 ng/mL (ref <0.031)

## 2015-08-21 MED ORDER — HYDROCODONE-ACETAMINOPHEN 5-325 MG PO TABS
1.0000 | ORAL_TABLET | Freq: Once | ORAL | Status: AC
  Administered 2015-08-21: 1 via ORAL
  Filled 2015-08-21: qty 1

## 2015-08-21 MED ORDER — FUROSEMIDE 10 MG/ML IJ SOLN
40.0000 mg | Freq: Once | INTRAMUSCULAR | Status: AC
  Administered 2015-08-21: 40 mg via INTRAVENOUS
  Filled 2015-08-21: qty 4

## 2015-08-21 NOTE — ED Provider Notes (Signed)
CSN: [id number]     Arrival date & time 08/21/15  2012 History  By signing my name below, I, Budd Palmer, attest that this documentation has been prepared under the direction and in the presence of Bethann Berkshire, MD. Electronically Signed: Budd Palmer, ED Scribe. 08/21/2015. 8:58 PM.    Chief Complaint  Patient presents with  . Shortness of Breath   Patient is a 59 y.o. male presenting with shortness of breath. The history is provided by the patient. No language interpreter was used.  Shortness of Breath Severity:  Moderate Onset quality:  Gradual Duration:  9 hours Timing:  Constant Chronicity:  New Associated symptoms: chest pain, cough, diaphoresis and fever   Associated symptoms: no abdominal pain, no headaches and no rash   Cough:    Cough characteristics:  Productive   Severity:  Moderate   Onset quality:  Gradual   Duration:  6 days   Chronicity:  New Fever:    Duration:  6 days   Timing:  Intermittent   Temp source:  Subjective Risk factors: tobacco use    HPI Comments: [name] is a 59 y.o. male smoker at 2 ppd with a PMHx of CAD, acute MI, HTN, and DM who presents to the Emergency Department complaining of SOB onset 9 hours ago. Pt states he has had a cold for the past 6 days. He reports associated productive cough (yellow sputum), intermittent subjective fevers, diaphoresis, fatigue, and CP. He states he has had similar CP before, when he had his 2 MI's.  Past Medical History  Diagnosis Date  . Coronary artery disease   . Diabetes mellitus   . Hypertension   . Acute MI [facility])    Past Surgical History  Procedure Laterality Date  . Coronary angioplasty with stent placement     History reviewed. No pertinent family history. Social History  Substance Use Topics  . Smoking status: Current Every Day Smoker -- 2.00 packs/day    Types: Cigarettes  . Smokeless tobacco: None  . Alcohol Use: No    Review of Systems  Constitutional: Positive for fever,  diaphoresis and fatigue. Negative for appetite change.  HENT: Negative for congestion, ear discharge and sinus pressure.   Eyes: Negative for discharge.  Respiratory: Positive for cough and shortness of breath.   Cardiovascular: Positive for chest pain.  Gastrointestinal: Negative for abdominal pain and diarrhea.  Genitourinary: Negative for frequency and hematuria.  Musculoskeletal: Negative for back pain.  Skin: Negative for rash.  Neurological: Negative for seizures and headaches.  Psychiatric/Behavioral: Negative for hallucinations.    Allergies  Review of patient's allergies indicates no known allergies.  Home Medications   Prior to Admission medications   Medication Sig Start Date End Date Taking? Authorizing Provider  ibuprofen (ADVIL,MOTRIN) 200 MG tablet Take 200 mg by mouth every 6 (six) hours as needed.    Historical Provider, MD  insulin aspart (NOVOLOG FLEXPEN) 100 UNIT/ML injection Inject into the skin 3 (three) times daily before meals. As directed per sliding scale instructions    Historical Provider, MD  insulin detemir (LEVEMIR FLEXPEN) 100 UNIT/ML injection Inject 20 Units into the skin every morning.    Historical Provider, MD  metFORMIN (GLUCOPHAGE) 500 MG tablet Take 500 mg by mouth 2 (two) times daily.    Historical Provider, MD   BP 163/109 mmHg  Pulse 105  Temp(Src) 97.5 F (36.4 C) (Oral)  Resp 13  Ht  (1.905 m)  Wt 226 lb (102.513 kg)  BMI  28.25 kg/m2  SpO2 99% Physical Exam  Constitutional: He is oriented to person, place, and time. He appears well-developed.  HENT:  Head: Normocephalic.  Eyes: Conjunctivae and EOM are normal. No scleral icterus.  Neck: Neck supple. No thyromegaly present.  Cardiovascular: Normal rate and regular rhythm.  Exam reveals no gallop and no friction rub.   No murmur heard. Pulmonary/Chest: No stridor. He has no wheezes. He has no rales. He exhibits no tenderness.  Abdominal: He exhibits no distension. There is no  tenderness. There is no rebound.  Musculoskeletal: Normal range of motion. He exhibits no edema.  Lymphadenopathy:    He has no cervical adenopathy.  Neurological: He is oriented to person, place, and time. He exhibits normal muscle tone. Coordination normal.  Skin: No rash noted. No erythema.  Psychiatric: He has a normal mood and affect. His behavior is normal.    ED Course  Procedures  DIAGNOSTIC STUDIES: Oxygen Saturation is 100% on RA, normal by my interpretation.    COORDINATION OF CARE: 8:53 PM - Discussed plans to order diagnostic studies. Pt advised of plan for treatment and pt agrees.  Labs Review Labs Reviewed  BASIC METABOLIC PANEL - Abnormal; Notable for the following:    Sodium 134 (*)    Glucose, Bld 156 (*)    All other components within normal limits  BRAIN NATRIURETIC PEPTIDE - Abnormal; Notable for the following:    B Natriuretic Peptide 579.0 (*)    All other components within normal limits  HEPATIC FUNCTION PANEL - Abnormal; Notable for the following:    ALT 15 (*)    All other components within normal limits  CBC WITH DIFFERENTIAL/PLATELET  TROPONIN I  Rosezena SensorI-STAT TROPOININ, ED    Imaging Review Dg Chest 2 View  08/21/2015  CLINICAL DATA:  Chest pain and shortness of Breath EXAM: CHEST - 2 VIEW COMPARISON:  01/10/2011 FINDINGS: Cardiac shadow is at the upper limits of normal in size. Diffuse interstitial changes are noted with evidence of interstitial edema. No focal confluent infiltrate is seen. No sizable effusion is noted. IMPRESSION: Mild CHF with interstitial edema. Electronically Signed   By: Alcide CleverMark  Lukens M.D.   On: 08/21/2015 20:41   I have personally reviewed and evaluated these images and lab results as part of my medical decision-making.   EKG Interpretation   Date/Time:  Tuesday August 21 2015 20:26:46 EDT Ventricular Rate:  110 PR Interval:  152 QRS Duration: 104 QT Interval:  344 QTC Calculation: 465 R Axis:   -28 Text Interpretation:   Sinus tachycardia Possible Left atrial enlargement  Left ventricular hypertrophy ST \\T \ T wave abnormality, consider  inferolateral ischemia Abnormal ECG Confirmed by KNAPP  MD-J, JON ([id number])  on 08/21/2015 8:34:21 PM      MDM   Final diagnoses:  None    Patient's BNP is elevated and chest x-ray shows congestive heart failure. He will be admitted to get some diuresis  Bethann BerkshireJoseph Essynce Munsch, MD 08/21/15 2253

## 2015-08-21 NOTE — ED Notes (Signed)
Pt with sob today at 1200, cold symptoms- stuffiness, fatigue, CP today

## 2015-08-22 ENCOUNTER — Encounter (HOSPITAL_COMMUNITY): Payer: Self-pay | Admitting: *Deleted

## 2015-08-22 DIAGNOSIS — I5033 Acute on chronic diastolic (congestive) heart failure: Secondary | ICD-10-CM | POA: Diagnosis not present

## 2015-08-22 DIAGNOSIS — I2510 Atherosclerotic heart disease of native coronary artery without angina pectoris: Secondary | ICD-10-CM | POA: Diagnosis not present

## 2015-08-22 DIAGNOSIS — I10 Essential (primary) hypertension: Secondary | ICD-10-CM

## 2015-08-22 LAB — TROPONIN I
TROPONIN I: 0.05 ng/mL — AB (ref <0.031)
TROPONIN I: 0.06 ng/mL — AB (ref <0.031)
Troponin I: 0.05 ng/mL — ABNORMAL HIGH (ref <0.031)

## 2015-08-22 LAB — GLUCOSE, CAPILLARY
GLUCOSE-CAPILLARY: 101 mg/dL — AB (ref 65–99)
GLUCOSE-CAPILLARY: 117 mg/dL — AB (ref 65–99)
Glucose-Capillary: 93 mg/dL (ref 65–99)
Glucose-Capillary: 153 mg/dL — ABNORMAL HIGH (ref 65–99)

## 2015-08-22 LAB — TSH: TSH: 0.967 u[IU]/mL (ref 0.350–4.500)

## 2015-08-22 MED ORDER — ONDANSETRON HCL 4 MG/2ML IJ SOLN: 4.0000 mg | Freq: Four times a day (QID) | INTRAMUSCULAR | Status: DC | PRN

## 2015-08-22 MED ORDER — FUROSEMIDE 10 MG/ML IJ SOLN
40.0000 mg | Freq: Every day | INTRAMUSCULAR | Status: DC
Start: 2015-08-22 — End: 2015-08-23
  Administered 2015-08-22 – 2015-08-23 (×2): 40 mg via INTRAVENOUS
  Filled 2015-08-22 (×2): qty 4

## 2015-08-22 MED ORDER — ACETAMINOPHEN 650 MG RE SUPP: 650.0000 mg | Freq: Four times a day (QID) | RECTAL | Status: DC | PRN

## 2015-08-22 MED ORDER — ASPIRIN 81 MG PO TABS
81.0000 mg | ORAL_TABLET | Freq: Every day | ORAL | Status: DC
  Filled 2015-08-22: qty 1

## 2015-08-22 MED ORDER — ONDANSETRON HCL 4 MG PO TABS: 4.0000 mg | ORAL_TABLET | Freq: Four times a day (QID) | ORAL | Status: DC | PRN

## 2015-08-22 MED ORDER — METFORMIN HCL 500 MG PO TABS: 500.0000 mg | ORAL_TABLET | Freq: Two times a day (BID) | ORAL | Status: DC

## 2015-08-22 MED ORDER — ASPIRIN 81 MG PO CHEW
81.0000 mg | CHEWABLE_TABLET | Freq: Every day | ORAL | Status: DC
  Administered 2015-08-22 – 2015-08-23 (×2): 81 mg via ORAL
  Filled 2015-08-22 (×4): qty 1

## 2015-08-22 MED ORDER — ACETAMINOPHEN 325 MG PO TABS: 650.0000 mg | ORAL_TABLET | Freq: Four times a day (QID) | ORAL | Status: DC | PRN

## 2015-08-22 MED ORDER — ENOXAPARIN SODIUM 40 MG/0.4ML ~~LOC~~ SOLN: 40.0000 mg | SUBCUTANEOUS | Status: DC

## 2015-08-22 MED ORDER — SODIUM CHLORIDE 0.9 % IJ SOLN
3.0000 mL | Freq: Two times a day (BID) | INTRAMUSCULAR | Status: DC
  Administered 2015-08-22 – 2015-08-23 (×4): 3 mL via INTRAVENOUS

## 2015-08-22 MED ORDER — INSULIN ASPART 100 UNIT/ML ~~LOC~~ SOLN
0.0000 [IU] | Freq: Three times a day (TID) | SUBCUTANEOUS | Status: DC
  Administered 2015-08-23: 1 [IU] via SUBCUTANEOUS

## 2015-08-22 MED ORDER — INSULIN ASPART 100 UNIT/ML ~~LOC~~ SOLN
0.0000 [IU] | Freq: Every day | SUBCUTANEOUS | Status: DC
Start: 2015-08-22 — End: 2015-08-23

## 2015-08-22 MED ORDER — HYDRALAZINE HCL 20 MG/ML IJ SOLN: 10.0000 mg | Freq: Four times a day (QID) | INTRAMUSCULAR | Status: DC | PRN

## 2015-08-22 MED ORDER — METFORMIN HCL 500 MG PO TABS
500.0000 mg | ORAL_TABLET | Freq: Two times a day (BID) | ORAL | Status: DC
  Administered 2015-08-22 – 2015-08-23 (×3): 500 mg via ORAL
  Filled 2015-08-22 (×3): qty 1

## 2015-08-22 MED ORDER — ENOXAPARIN SODIUM 40 MG/0.4ML ~~LOC~~ SOLN
40.0000 mg | SUBCUTANEOUS | Status: DC
  Administered 2015-08-22 – 2015-08-23 (×2): 40 mg via SUBCUTANEOUS
  Filled 2015-08-22 (×2): qty 0.4

## 2015-08-22 NOTE — H&P (Signed)
[facility] History and Physical  [name] ZOX:[id number] DOB: May 09, 1956    PCP:   Evlyn Courier, MD   Chief Complaint: SOB and PND.  HPI: [name] is an 59 y.o. male with hx of known CAD, s/p Cardiac stent placements ([facility]), HTN, DM, prior MI, with negative nuclear stress test in August 2016 showing fixed defects and moderate risk, good EF, unfortunately active tobacco use, presented to the ER with orthopnea and PND for the past week.  He has not been on any diuretics, and denied any weight gain.  He has no chest pain, but had mild cough, and no fever, chills, or leukocytosis.  Work up in the ER included CXR with mild vascular congestion and no infiltrate, negative troponin, and BNP was 579.  He was given Lasix  IV and hospitalist wa s asked to admit him for mild congestive heart failure requiring further diuresis.    Rewiew of Systems:  Constitutional: Negative for malaise, fever and chills. No significant weight loss or weight gain Eyes: Negative for eye pain, redness and discharge, diplopia, visual changes, or flashes of light. ENMT: Negative for ear pain, hoarseness, nasal congestion, sinus pressure and sore throat. No headaches; tinnitus, drooling, or problem swallowing. Cardiovascular: Negative for chest pain, palpitations, diaphoresis, and peripheral edema.  Respiratory: Negative for cough, hemoptysis, wheezing and stridor. No pleuritic chestpain. Gastrointestinal: Negative for nausea, vomiting, diarrhea, constipation, abdominal pain, melena, blood in stool, hematemesis, jaundice and rectal bleeding.    Genitourinary: Negative for frequency, dysuria, incontinence,flank pain and hematuria; Musculoskeletal: Negative for back pain and neck pain. Negative for swelling and trauma.;  Skin: . Negative for pruritus, rash, abrasions, bruising and skin lesion.; ulcerations Neuro: Negative for headache, lightheadedness and neck stiffness. Negative for weakness, altered  level of consciousness , altered mental status, extremity weakness, burning feet, involuntary movement, seizure and syncope.  Psych: negative for anxiety, depression, insomnia, tearfulness, panic attacks, hallucinations, paranoia, suicidal or homicidal ideation    Past Medical History  Diagnosis Date  . Coronary artery disease   . Diabetes mellitus   . Hypertension   . Acute MI [facility])     Past Surgical History  Procedure Laterality Date  . Coronary angioplasty with stent placement      Medications:  HOME MEDS: Prior to Admission medications   Medication Sig Start Date End Date Taking? Authorizing Provider  aspirin 81 MG tablet Take 81 mg by mouth daily.   Yes Historical Provider, MD  metFORMIN (GLUCOPHAGE) 500 MG tablet Take 500 mg by mouth 2 (two) times daily.   Yes Historical Provider, MD  ibuprofen (ADVIL,MOTRIN) 200 MG tablet Take 200 mg by mouth every 6 (six) hours as needed for mild pain or moderate pain.     Historical Provider, MD  insulin aspart (NOVOLOG FLEXPEN) 100 UNIT/ML injection Inject into the skin 3 (three) times daily before meals. As directed per sliding scale instructions    Historical Provider, MD  insulin detemir (LEVEMIR FLEXPEN) 100 UNIT/ML injection Inject 20 Units into the skin every morning.    Historical Provider, MD     Allergies:  No Known Allergies  Social History:   reports that he has been smoking Cigarettes.  He has been smoking about 2.00 packs per day. He does not have any smokeless tobacco history on file. He reports that he does not drink alcohol or use illicit drugs.  Family History: History reviewed. No pertinent family history.   Physical Exam: Filed Vitals:   08/21/15 2230 08/21/15 2300 08/21/15  2330 08/22/15 0000  BP: 145/96 164/107 150/108 170/124  Pulse: 100 106 102 125  Temp:      TempSrc:      Resp:  13    Height:      Weight:      SpO2: 95% 95% 98% 97%   Blood pressure 170/124, pulse 125, temperature 97.5 F (36.4 C),  temperature source Oral, resp. rate 13, height  (1.905 m), weight 102.513 kg (226 lb), SpO2 97 %.  GEN:  Pleasant  patient lying in the stretcher in no acute distress; cooperative with exam. PSYCH:  alert and oriented x4; does not appear anxious or depressed; affect is appropriate. HEENT: Mucous membranes pink and anicteric; PERRLA; EOM intact; no cervical lymphadenopathy nor thyromegaly or carotid bruit; no JVD; There were no stridor. Neck is very supple. Breasts:: Not examined CHEST WALL: No tenderness CHEST: Normal respiration, basilar rales, no wheezing.  HEART: Regular rate and rhythm.  There are no murmur, rub, or gallops.   BACK: No kyphosis or scoliosis; no CVA tenderness ABDOMEN: soft and non-tender; no masses, no organomegaly, normal abdominal bowel sounds; no pannus; no intertriginous candida. There is no rebound and no distention. Rectal Exam: Not done EXTREMITIES: No bone or joint deformity; age-appropriate arthropathy of the hands and knees; no edema; no ulcerations.  There is no calf tenderness. Genitalia: not examined PULSES: 2+ and symmetric SKIN: Normal hydration no rash or ulceration CNS: Cranial nerves 2-12 grossly intact no focal lateralizing neurologic deficit.  Speech is fluent; uvula elevated with phonation, facial symmetry and tongue midline. DTR are normal bilaterally, cerebella exam is intact, barbinski is negative and strengths are equaled bilaterally.  No sensory loss.   Labs on Admission:  Basic Metabolic Panel:  Recent Labs Lab 08/21/15 2052  NA 134*  K 3.8  CL 103  CO2 23  GLUCOSE 156*  BUN 7  CREATININE 1.16  CALCIUM 9.1   Liver Function Tests:  Recent Labs Lab 08/21/15 2052  AST 19  ALT 15*  ALKPHOS 83  BILITOT 1.1  PROT 7.7  ALBUMIN 4.0   CBC:  Recent Labs Lab 08/21/15 2052  WBC 8.5  NEUTROABS 4.6  HGB 14.7  HCT 43.0  MCV 83.3  PLT 251   Cardiac Enzymes:  Recent Labs Lab 08/21/15 2052  TROPONINI 0.03     Radiological Exams on Admission: Dg Chest 2 View  08/21/2015  CLINICAL DATA:  Chest pain and shortness of Breath EXAM: CHEST - 2 VIEW COMPARISON:  01/10/2011 FINDINGS: Cardiac shadow is at the upper limits of normal in size. Diffuse interstitial changes are noted with evidence of interstitial edema. No focal confluent infiltrate is seen. No sizable effusion is noted. IMPRESSION: Mild CHF with interstitial edema. Electronically Signed   By: Alcide Clever M.D.   On: 08/21/2015 20:41    EKG: Independently reviewed.   Assessment/Plan Present on Admission:  . CAD in native artery . HTN (hypertension) with goal to be determined Mild CHF. DM Tobacco Abuse.   PLAN: Will admit him to telemetry for mild CHF.  Will give IV Lasix daily.  Obtain i/o and daily weight.  His recent nuclear stress test showing no myocardium at risk is reassuring.  Strongly encourage to quit cigarettes.  For his HTN, will continue with his meds.  He is stable, full code, and will be admitted to [facility] service.  Thanks.   Other plans as per orders.  Code Status: Boykin Peek, MD. [facility] Pager [phone]  7pm to 7am.  08/22/2015, 12:43 AM

## 2015-08-22 NOTE — Care Management Note (Signed)
Case Management Note  Patient Details  Name: Luis StarchLorenza Hoard MRN: [id number] Date of Birth: 03-25-1956  Subjective/Objective:                  Pt admitted from home with CHF. Pt lives with his wife and will return home at discharge. Pt is independent with ADL's.  Action/Plan: No CM needs anticipated.  Expected Discharge Date:  08/25/15               Expected Discharge Plan:  Home/Self Care  In-House Referral:  NA  Discharge planning Services  CM Consult  Post Acute Care Choice:  NA Choice offered to:  NA  DME Arranged:    DME Agency:     HH Arranged:    HH Agency:     Status of Service:  Completed, signed off  Medicare Important Message Given:    Date Medicare IM Given:    Medicare IM give by:    Date Additional Medicare IM Given:    Additional Medicare Important Message give by:     If discussed at Long Length of Stay Meetings, dates discussed:    Additional Comments:  Cheryl FlashBlackwell, Dazani Norby Crowder, RN 08/22/2015, 10:59 AM

## 2015-08-22 NOTE — Progress Notes (Signed)
Signed consent for records to be obtained from Dr. Sharyn LullHarwani.

## 2015-08-22 NOTE — Progress Notes (Signed)
Dr. Ardyth HarpsHernandez returned call, aware of patient's cardiac rhythm, [facility], no new orders at this time.  Will continue to monitor.

## 2015-08-22 NOTE — Progress Notes (Signed)
Patient seen and examined. Discussed with wife Vikki PortsValerie at bedside. Admitted earlier today with orthopnea. Found to have pulmonary edema on CXR. Cannot find recent ECHO in chart, altho patient says he had one done at Dr. Annitta [place]wani[place] long ago and was told he had a "thickened heart muscle due to HTN" (suspect diastolic CHF). Will request records. Continue IV lasix as still has signs of mild volume overload on exam. Will continue to follow.  Peggye PittEstela Hernandez, MD [facility] Pager: [phone]

## 2015-08-22 NOTE — Progress Notes (Signed)
[facility], patient  In V-tach/v-fib, went to room, several visitors present, patient in bed, coughing and heart rate increases and then returns to normal, asked patient to stay still to evaluate heart rhythm, Sinus rhythm 99.  Dr. Ardyth HarpsHernandez sent e-page.  Waiting response.

## 2015-08-23 DIAGNOSIS — I5033 Acute on chronic diastolic (congestive) heart failure: Secondary | ICD-10-CM | POA: Diagnosis not present

## 2015-08-23 DIAGNOSIS — I10 Essential (primary) hypertension: Secondary | ICD-10-CM | POA: Diagnosis not present

## 2015-08-23 LAB — BASIC METABOLIC PANEL
Anion gap: 7 (ref 5–15)
BUN: 7 mg/dL (ref 6–20)
CHLORIDE: 103 mmol/L (ref 101–111)
CO2: 25 mmol/L (ref 22–32)
CREATININE: 1.01 mg/dL (ref 0.61–1.24)
Calcium: 8.9 mg/dL (ref 8.9–10.3)
GFR calc Af Amer: >60 mL/min (ref >60)
GFR calc non Af Amer: >60 mL/min (ref >60)
GLUCOSE: 93 mg/dL (ref 65–99)
POTASSIUM: 3.7 mmol/L (ref 3.5–5.1)
SODIUM: 135 mmol/L (ref 135–145)

## 2015-08-23 LAB — CBC
HEMATOCRIT: 41.4 % (ref 39.0–52.0)
Hemoglobin: 14.3 g/dL (ref 13.0–17.0)
MCH: 28.4 pg (ref 26.0–34.0)
MCHC: 34.5 g/dL (ref 30.0–36.0)
MCV: 82.3 fL (ref 78.0–100.0)
PLATELETS: 231 10*3/uL (ref 150–400)
RBC: 5.03 MIL/uL (ref 4.22–5.81)
RDW: 13.9 % (ref 11.5–15.5)
WBC: 5.9 10*3/uL (ref 4.0–10.5)

## 2015-08-23 LAB — GLUCOSE, CAPILLARY
GLUCOSE-CAPILLARY: 109 mg/dL — AB (ref 65–99)
Glucose-Capillary: 105 mg/dL — ABNORMAL HIGH (ref 65–99)
Glucose-Capillary: 134 mg/dL — ABNORMAL HIGH (ref 65–99)

## 2015-08-23 MED ORDER — FUROSEMIDE 40 MG PO TABS: 40.0000 mg | ORAL_TABLET | Freq: Every day | ORAL | Status: AC

## 2015-08-23 NOTE — Progress Notes (Signed)
Left floor via wheelchair for discharge home with friend, heart failure booklet with patient and discharge instructions.  Stable at discharge.

## 2015-08-23 NOTE — Progress Notes (Signed)
Discharge order in computer, [facility] called and telemetry removed by JeffersShana, NT and IV removed by DumasShana, NT.  Discharge instructions printed and reviewed with patient, copy signed.  Waiting ride home.

## 2015-08-23 NOTE — Discharge Summary (Signed)
Physician Discharge Summary  [name] UJW:[id number] DOB: 1956-09-15 DOA: 08/21/2015  PCP: Evlyn Courier, MD  Admit date: 08/21/2015 Discharge date: 08/23/2015  Time spent: 45 minutes  Recommendations for Outpatient Follow-up:  -Will be discharged home today. -Advised to follow up with PCP in 1-2 weeks.   Discharge Diagnoses:  Principal Problem:   Congestive heart failure (HCC) Active Problems:   CAD in native artery   HTN (hypertension) with goal to be determined   DM (diabetes mellitus) (HCC)   CHF (congestive heart failure) (HCC)   Discharge Condition: Stable and improved  Filed Weights   08/21/15 2025 08/22/15 0148 08/23/15 0617  Weight: 102.513 kg (226 lb) 99.7 kg (219 lb 12.8 oz) 96.48 kg (212 lb 11.2 oz)    History of present illness:  As per Dr. Conley Rolls 10/19: [name] is an 59 y.o. male with hx of known CAD, s/p Cardiac stent placements ([facility]), HTN, DM, prior MI, with negative nuclear stress test in August 2016 showing fixed defects and moderate risk, good EF, unfortunately active tobacco use, presented to the ER with orthopnea and PND for the past week. He has not been on any diuretics, and denied any weight gain. He has no chest pain, but had mild cough, and no fever, chills, or leukocytosis. Work up in the ER included CXR with mild vascular congestion and no infiltrate, negative troponin, and BNP was 579. He was given Lasix  IV and hospitalist wa s asked to admit him for mild congestive heart failure requiring further diuresis.   Hospital Course:   Acute on Chronic Diastolic CHF, presumed -Patient states he had an ECHO within the last 2 months at Dr. Annitta Jersey office and was told he had a "stiff heart muscle due to HTN". -Was placed on IV lasix; Is and Os have been inaccurate, however he has no lower extremity edema and is no longer SOB, orthopneic and no longer has any oxygen requirements. -Will DC on lasix 40 mg daily.  HTN -Fair  control. -Continue OP management.  DM II -Well controlled.  Tobacco Abuse -Counseled on cessation  Procedures:  None   Consultations:  None  Discharge Instructions  Discharge Instructions    Diet - low sodium heart healthy    Complete by:  As directed      Increase activity slowly    Complete by:  As directed             Medication List    TAKE these medications        aspirin 81 MG tablet  Take 81 mg by mouth daily.     clopidogrel 75 MG tablet  Commonly known as:  PLAVIX  Take 75 mg by mouth daily.     furosemide 40 MG tablet  Commonly known as:  LASIX  Take 1 tablet (40 mg total) by mouth daily.     glimepiride 4 MG tablet  Commonly known as:  AMARYL  Take 4 mg by mouth 2 (two) times daily.     ibuprofen 200 MG tablet  Commonly known as:  ADVIL,MOTRIN  Take 200 mg by mouth every 6 (six) hours as needed for mild pain or moderate pain.     lisinopril 40 MG tablet  Commonly known as:  PRINIVIL,ZESTRIL  Take 40 mg by mouth 2 (two) times daily.     metFORMIN 500 MG tablet  Commonly known as:  GLUCOPHAGE  Take 500 mg by mouth 2 (two) times daily.     metoprolol  50 MG tablet  Commonly known as:  LOPRESSOR  Take 50 mg by mouth 2 (two) times daily.     nitroGLYCERIN 0.4 MG SL tablet  Commonly known as:  NITROSTAT  Place 0.4 mg under the tongue every 5 (five) minutes as needed for chest pain.     simvastatin 40 MG tablet  Commonly known as:  ZOCOR  Take 40 mg by mouth at bedtime.       No Known Allergies     Follow-up Information    Follow up with HILL,GERALD K, MD. Schedule an appointment as soon as possible for a visit in 2 weeks.   Specialty:  Family Medicine   Contact information:   [address] [place] [address] [phone]        The results of significant diagnostics from this hospitalization (including imaging, microbiology, ancillary and laboratory) are listed below for reference.    Significant Diagnostic  Studies: Dg Chest 2 View  08/21/2015  CLINICAL DATA:  Chest pain and shortness of Breath EXAM: CHEST - 2 VIEW COMPARISON:  01/10/2011 FINDINGS: Cardiac shadow is at the upper limits of normal in size. Diffuse interstitial changes are noted with evidence of interstitial edema. No focal confluent infiltrate is seen. No sizable effusion is noted. IMPRESSION: Mild CHF with interstitial edema. Electronically Signed   By: Alcide CleverMark  Lukens M.D.   On: 08/21/2015 20:41    Microbiology: No results found for this or any previous visit (from the past 240 hour(s)).   Labs: Basic Metabolic Panel:  Recent Labs Lab 08/21/15 2052 08/23/15 0515  NA 134* 135  K 3.8 3.7  CL 103 103  CO2 23 25  GLUCOSE 156* 93  BUN 7 7  CREATININE 1.16 1.01  CALCIUM 9.1 8.9   Liver Function Tests:  Recent Labs Lab 08/21/15 2052  AST 19  ALT 15*  ALKPHOS 83  BILITOT 1.1  PROT 7.7  ALBUMIN 4.0   No results for input(s): LIPASE, AMYLASE in the last 168 hours. No results for input(s): AMMONIA in the last 168 hours. CBC:  Recent Labs Lab 08/21/15 2052 08/23/15 0515  WBC 8.5 5.9  NEUTROABS 4.6  --   HGB 14.7 14.3  HCT 43.0 41.4  MCV 83.3 82.3  PLT 251 231   Cardiac Enzymes:  Recent Labs Lab 08/21/15 2052 08/22/15 0150 08/22/15 0717 08/22/15 1330  TROPONINI 0.03 0.05* 0.05* 0.06*   BNP: BNP (last 3 results)  Recent Labs  08/21/15 2052  BNP 579.0*    ProBNP (last 3 results) No results for input(s): PROBNP in the last 8760 hours.  CBG:  Recent Labs Lab 08/22/15 1114 08/22/15 1636 08/22/15 2106 08/23/15 0709 08/23/15 1130  GLUCAP 101* 117* 105* 134* 109*       Signed:  HERNANDEZ ACOSTA,ESTELA  [facility] Pager: [phone] 08/23/2015, 1:53 PM

## 2015-08-23 NOTE — Care Management Note (Signed)
Case Management Note  Patient Details  Name: [name] MRN: [id number] Date of Birth: [birth year]  Subjective/Objective:                    Action/Plan:   Expected Discharge Date:  08/25/15               Expected Discharge Plan:  Home/Self Care  In-House Referral:  NA  Discharge planning Services  CM Consult  Post Acute Care Choice:  NA Choice offered to:  NA  DME Arranged:    DME Agency:     HH Arranged:    HH Agency:     Status of Service:  Completed, signed off  Medicare Important Message Given:    Date Medicare IM Given:    Medicare IM give by:    Date Additional Medicare IM Given:    Additional Medicare Important Message give by:     If discussed at Long Length of Stay Meetings, dates discussed:    Additional Comments: Pt discharged home today. No CM needs noted. [name], Luis Symes Chambleerowder, RN 08/23/2015, 12:53 PM

## 2015-10-27 ENCOUNTER — Emergency Department (HOSPITAL_COMMUNITY): Payer: BLUE CROSS/BLUE SHIELD

## 2015-10-27 ENCOUNTER — Encounter (HOSPITAL_COMMUNITY): Admission: EM | Disposition: E | Payer: Self-pay | Source: Home / Self Care | Attending: Cardiology

## 2015-10-27 ENCOUNTER — Encounter (HOSPITAL_COMMUNITY): Payer: Self-pay | Admitting: Emergency Medicine

## 2015-10-27 ENCOUNTER — Inpatient Hospital Stay (HOSPITAL_COMMUNITY)
Admission: EM | Admit: 2015-10-27 | Discharge: 2015-11-04 | DRG: 246 | Disposition: E | Payer: BLUE CROSS/BLUE SHIELD | Attending: Cardiology | Admitting: Cardiology

## 2015-10-27 DIAGNOSIS — E872 Acidosis: Secondary | ICD-10-CM | POA: Diagnosis not present

## 2015-10-27 DIAGNOSIS — Z7902 Long term (current) use of antithrombotics/antiplatelets: Secondary | ICD-10-CM

## 2015-10-27 DIAGNOSIS — R570 Cardiogenic shock: Secondary | ICD-10-CM | POA: Diagnosis present

## 2015-10-27 DIAGNOSIS — Z8249 Family history of ischemic heart disease and other diseases of the circulatory system: Secondary | ICD-10-CM

## 2015-10-27 DIAGNOSIS — Z955 Presence of coronary angioplasty implant and graft: Secondary | ICD-10-CM

## 2015-10-27 DIAGNOSIS — I2119 ST elevation (STEMI) myocardial infarction involving other coronary artery of inferior wall: Secondary | ICD-10-CM | POA: Diagnosis present

## 2015-10-27 DIAGNOSIS — I255 Ischemic cardiomyopathy: Secondary | ICD-10-CM | POA: Diagnosis present

## 2015-10-27 DIAGNOSIS — I213 ST elevation (STEMI) myocardial infarction of unspecified site: Secondary | ICD-10-CM

## 2015-10-27 DIAGNOSIS — D62 Acute posthemorrhagic anemia: Secondary | ICD-10-CM | POA: Diagnosis not present

## 2015-10-27 DIAGNOSIS — I237 Postinfarction angina: Secondary | ICD-10-CM | POA: Diagnosis present

## 2015-10-27 DIAGNOSIS — F17210 Nicotine dependence, cigarettes, uncomplicated: Secondary | ICD-10-CM | POA: Diagnosis present

## 2015-10-27 DIAGNOSIS — K219 Gastro-esophageal reflux disease without esophagitis: Secondary | ICD-10-CM | POA: Diagnosis present

## 2015-10-27 DIAGNOSIS — Z7982 Long term (current) use of aspirin: Secondary | ICD-10-CM

## 2015-10-27 DIAGNOSIS — T508X5A Adverse effect of diagnostic agents, initial encounter: Secondary | ICD-10-CM | POA: Diagnosis not present

## 2015-10-27 DIAGNOSIS — I442 Atrioventricular block, complete: Secondary | ICD-10-CM | POA: Diagnosis not present

## 2015-10-27 DIAGNOSIS — Z950 Presence of cardiac pacemaker: Secondary | ICD-10-CM

## 2015-10-27 DIAGNOSIS — J9600 Acute respiratory failure, unspecified whether with hypoxia or hypercapnia: Secondary | ICD-10-CM | POA: Diagnosis not present

## 2015-10-27 DIAGNOSIS — I119 Hypertensive heart disease without heart failure: Secondary | ICD-10-CM | POA: Diagnosis present

## 2015-10-27 DIAGNOSIS — I2111 ST elevation (STEMI) myocardial infarction involving right coronary artery: Secondary | ICD-10-CM | POA: Diagnosis not present

## 2015-10-27 DIAGNOSIS — I2510 Atherosclerotic heart disease of native coronary artery without angina pectoris: Secondary | ICD-10-CM | POA: Diagnosis present

## 2015-10-27 DIAGNOSIS — N179 Acute kidney failure, unspecified: Secondary | ICD-10-CM | POA: Diagnosis not present

## 2015-10-27 DIAGNOSIS — Z7984 Long term (current) use of oral hypoglycemic drugs: Secondary | ICD-10-CM

## 2015-10-27 DIAGNOSIS — M1990 Unspecified osteoarthritis, unspecified site: Secondary | ICD-10-CM | POA: Diagnosis present

## 2015-10-27 DIAGNOSIS — J9601 Acute respiratory failure with hypoxia: Secondary | ICD-10-CM

## 2015-10-27 DIAGNOSIS — I252 Old myocardial infarction: Secondary | ICD-10-CM

## 2015-10-27 DIAGNOSIS — F17200 Nicotine dependence, unspecified, uncomplicated: Secondary | ICD-10-CM | POA: Diagnosis present

## 2015-10-27 DIAGNOSIS — E1151 Type 2 diabetes mellitus with diabetic peripheral angiopathy without gangrene: Secondary | ICD-10-CM | POA: Diagnosis present

## 2015-10-27 HISTORY — PX: CARDIAC CATHETERIZATION: SHX172

## 2015-10-27 LAB — I-STAT CHEM 8, ED
BUN: 15 mg/dL (ref 6–20)
CREATININE: 1.1 mg/dL (ref 0.61–1.24)
Calcium, Ion: 1.16 mmol/L (ref 1.12–1.23)
Chloride: 96 mmol/L — ABNORMAL LOW (ref 101–111)
GLUCOSE: 163 mg/dL — AB (ref 65–99)
HCT: 37 % — ABNORMAL LOW (ref 39.0–52.0)
HEMOGLOBIN: 12.6 g/dL — AB (ref 13.0–17.0)
Potassium: 3.9 mmol/L (ref 3.5–5.1)
Sodium: 131 mmol/L — ABNORMAL LOW (ref 135–145)
TCO2: 22 mmol/L (ref 0–100)

## 2015-10-27 LAB — CBC WITH DIFFERENTIAL/PLATELET
Basophils Absolute: 0 10*3/uL (ref 0.0–0.1)
Basophils Relative: 0 %
EOS ABS: 0 10*3/uL (ref 0.0–0.7)
Eosinophils Relative: 0 %
HCT: 34 % — ABNORMAL LOW (ref 39.0–52.0)
HEMOGLOBIN: 11.6 g/dL — AB (ref 13.0–17.0)
LYMPHS ABS: 2.8 10*3/uL (ref 0.7–4.0)
Lymphocytes Relative: 18 %
MCH: 27.8 pg (ref 26.0–34.0)
MCHC: 34.1 g/dL (ref 30.0–36.0)
MCV: 81.3 fL (ref 78.0–100.0)
MONO ABS: 1.7 10*3/uL — AB (ref 0.1–1.0)
MONOS PCT: 10 %
NEUTROS PCT: 72 %
Neutro Abs: 11.6 10*3/uL — ABNORMAL HIGH (ref 1.7–7.7)
Platelets: 221 10*3/uL (ref 150–400)
RBC: 4.18 MIL/uL — ABNORMAL LOW (ref 4.22–5.81)
RDW: 14 % (ref 11.5–15.5)
WBC: 16.1 10*3/uL — ABNORMAL HIGH (ref 4.0–10.5)

## 2015-10-27 LAB — COMPREHENSIVE METABOLIC PANEL
ALK PHOS: 76 U/L (ref 38–126)
ALT: 46 U/L (ref 17–63)
ANION GAP: 9 (ref 5–15)
AST: 179 U/L — ABNORMAL HIGH (ref 15–41)
Albumin: 3.4 g/dL — ABNORMAL LOW (ref 3.5–5.0)
BILIRUBIN TOTAL: 2 mg/dL — AB (ref 0.3–1.2)
BUN: 16 mg/dL (ref 6–20)
CALCIUM: 9.1 mg/dL (ref 8.9–10.3)
CO2: 22 mmol/L (ref 22–32)
Chloride: 98 mmol/L — ABNORMAL LOW (ref 101–111)
Creatinine, Ser: 1.21 mg/dL (ref 0.61–1.24)
GFR calc non Af Amer: >60 mL/min (ref >60)
Glucose, Bld: 171 mg/dL — ABNORMAL HIGH (ref 65–99)
Potassium: 3.9 mmol/L (ref 3.5–5.1)
SODIUM: 129 mmol/L — AB (ref 135–145)
TOTAL PROTEIN: 7.5 g/dL (ref 6.5–8.1)

## 2015-10-27 LAB — TROPONIN I: Troponin I: 66.56 ng/mL (ref <0.031)

## 2015-10-27 LAB — CBG MONITORING, ED: Glucose-Capillary: 157 mg/dL — ABNORMAL HIGH (ref 65–99)

## 2015-10-27 LAB — I-STAT TROPONIN, ED

## 2015-10-27 LAB — PROTIME-INR
INR: 1.29 (ref 0.00–1.49)
PROTHROMBIN TIME: 16.2 s — AB (ref 11.6–15.2)

## 2015-10-27 LAB — APTT: aPTT: 36 seconds (ref 24–37)

## 2015-10-27 SURGERY — LEFT HEART CATH AND CORONARY ANGIOGRAPHY
Anesthesia: LOCAL

## 2015-10-27 MED ORDER — ASPIRIN 81 MG PO CHEW
CHEWABLE_TABLET | ORAL | Status: AC
  Filled 2015-10-27: qty 2

## 2015-10-27 MED ORDER — VERAPAMIL HCL 2.5 MG/ML IV SOLN
INTRAVENOUS | Status: DC | PRN
  Administered 2015-10-27: 150 ug via INTRACORONARY
  Administered 2015-10-27 (×2): 200 ug via INTRACORONARY

## 2015-10-27 MED ORDER — SODIUM CHLORIDE 0.9 % IV SOLN
INTRAVENOUS | Status: DC
  Administered 2015-10-27: 20:00:00 via INTRAVENOUS

## 2015-10-27 MED ORDER — BIVALIRUDIN 250 MG IV SOLR
INTRAVENOUS | Status: AC
  Filled 2015-10-27: qty 250

## 2015-10-27 MED ORDER — NITROGLYCERIN 1 MG/10 ML FOR IR/CATH LAB
INTRA_ARTERIAL | Status: DC | PRN
  Administered 2015-10-27

## 2015-10-27 MED ORDER — ONDANSETRON HCL 4 MG/2ML IJ SOLN
INTRAMUSCULAR | Status: AC
Start: 2015-10-27 — End: 2015-10-27
  Filled 2015-10-27: qty 2

## 2015-10-27 MED ORDER — SODIUM CHLORIDE 0.9 % IV SOLN
INTRAVENOUS | Status: DC | PRN
  Administered 2015-10-27: 999 mL/h via INTRAVENOUS

## 2015-10-27 MED ORDER — IOHEXOL 350 MG/ML SOLN
INTRAVENOUS | Status: DC | PRN
  Administered 2015-10-27: 205 mL via INTRAVENOUS

## 2015-10-27 MED ORDER — SODIUM CHLORIDE 0.9 % IV SOLN
250.0000 mg | INTRAVENOUS | Status: DC | PRN
  Administered 2015-10-27: 1.75 mg/kg/h via INTRAVENOUS
  Administered 2015-10-27: 250 mg

## 2015-10-27 MED ORDER — MIDAZOLAM HCL 2 MG/2ML IJ SOLN
INTRAMUSCULAR | Status: AC
  Filled 2015-10-27: qty 2

## 2015-10-27 MED ORDER — TIROFIBAN HCL IN NACL 5-0.9 MG/100ML-% IV SOLN
INTRAVENOUS | Status: AC
  Filled 2015-10-27: qty 100

## 2015-10-27 MED ORDER — HEPARIN SODIUM (PORCINE) 5000 UNIT/ML IJ SOLN
INTRAMUSCULAR | Status: AC
  Filled 2015-10-27: qty 1

## 2015-10-27 MED ORDER — AMIODARONE HCL 150 MG/3ML IV SOLN
INTRAVENOUS | Status: AC
  Filled 2015-10-27: qty 3

## 2015-10-27 MED ORDER — LIDOCAINE HCL (PF) 1 % IJ SOLN
INTRAMUSCULAR | Status: DC | PRN
  Administered 2015-10-27: 30 mL

## 2015-10-27 MED ORDER — HEPARIN (PORCINE) IN NACL 2-0.9 UNIT/ML-% IJ SOLN
INTRAMUSCULAR | Status: AC
  Filled 2015-10-27: qty 1000

## 2015-10-27 MED ORDER — TIROFIBAN HCL IN NACL 5-0.9 MG/100ML-% IV SOLN
INTRAVENOUS | Status: AC
Start: 2015-10-27 — End: 2015-10-27
  Filled 2015-10-27: qty 100

## 2015-10-27 MED ORDER — TIROFIBAN HCL IN NACL 5-0.9 MG/100ML-% IV SOLN
INTRAVENOUS | Status: DC | PRN
  Administered 2015-10-27
  Administered 2015-10-27: 0.15 ug/kg/min via INTRAVENOUS

## 2015-10-27 MED ORDER — HEPARIN (PORCINE) IN NACL 100-0.45 UNIT/ML-% IJ SOLN
INTRAMUSCULAR | Status: AC
  Filled 2015-10-27: qty 250

## 2015-10-27 MED ORDER — AMIODARONE HCL IN DEXTROSE 360-4.14 MG/200ML-% IV SOLN
60.0000 mg/h | INTRAVENOUS | Status: AC
  Administered 2015-10-28: 60 mg/h via INTRAVENOUS
  Filled 2015-10-27: qty 200

## 2015-10-27 MED ORDER — DOPAMINE-DEXTROSE 3.2-5 MG/ML-% IV SOLN
INTRAVENOUS | Status: AC
Start: 2015-10-27 — End: 2015-10-27
  Filled 2015-10-27: qty 250

## 2015-10-27 MED ORDER — VERAPAMIL HCL 2.5 MG/ML IV SOLN
INTRAVENOUS | Status: AC
Start: 2015-10-27 — End: 2015-10-27
  Filled 2015-10-27: qty 2

## 2015-10-27 MED ORDER — TICAGRELOR 90 MG PO TABS
180.0000 mg | ORAL_TABLET | Freq: Once | ORAL | Status: AC
  Administered 2015-10-27: 180 mg via ORAL
  Filled 2015-10-27: qty 2

## 2015-10-27 MED ORDER — DOPAMINE-DEXTROSE 3.2-5 MG/ML-% IV SOLN
INTRAVENOUS | Status: DC | PRN
  Administered 2015-10-27: 5 ug/kg/min via INTRAVENOUS

## 2015-10-27 MED ORDER — TIROFIBAN (AGGRASTAT) BOLUS VIA INFUSION
INTRAVENOUS | Status: DC | PRN
  Administered 2015-10-27: 2382.5 ug via INTRAVENOUS

## 2015-10-27 MED ORDER — FENTANYL CITRATE (PF) 100 MCG/2ML IJ SOLN
INTRAMUSCULAR | Status: AC
  Filled 2015-10-27: qty 2

## 2015-10-27 MED ORDER — BIVALIRUDIN BOLUS VIA INFUSION - CUPID
INTRAVENOUS | Status: DC | PRN
  Administered 2015-10-27: 71.475 mg via INTRAVENOUS

## 2015-10-27 MED ORDER — NITROGLYCERIN 1 MG/10 ML FOR IR/CATH LAB
INTRA_ARTERIAL | Status: AC
Start: 2015-10-27 — End: 2015-10-27
  Filled 2015-10-27: qty 10

## 2015-10-27 MED ORDER — HEPARIN SODIUM (PORCINE) 5000 UNIT/ML IJ SOLN
4000.0000 [IU] | INTRAMUSCULAR | Status: AC
  Administered 2015-10-27: 4000 [IU] via INTRAVENOUS

## 2015-10-27 MED ORDER — ONDANSETRON HCL 4 MG/2ML IJ SOLN
INTRAMUSCULAR | Status: DC | PRN
  Administered 2015-10-27: 4 mg via INTRAVENOUS

## 2015-10-27 MED ORDER — LIDOCAINE HCL (PF) 1 % IJ SOLN
INTRAMUSCULAR | Status: AC
  Filled 2015-10-27: qty 30

## 2015-10-27 MED ORDER — NOREPINEPHRINE BITARTRATE 1 MG/ML IV SOLN
INTRAVENOUS | Status: AC
  Filled 2015-10-27: qty 4

## 2015-10-27 MED ORDER — ASPIRIN 81 MG PO CHEW
324.0000 mg | CHEWABLE_TABLET | Freq: Once | ORAL | Status: AC
  Administered 2015-10-27: 324 mg via ORAL

## 2015-10-27 MED ORDER — NITROGLYCERIN 1 MG/10 ML FOR IR/CATH LAB
INTRA_ARTERIAL | Status: DC | PRN
Start: 2015-10-27 — End: 2015-10-27
  Administered 2015-10-27: 150 ug via INTRACORONARY

## 2015-10-27 MED ORDER — AMIODARONE HCL IN DEXTROSE 360-4.14 MG/200ML-% IV SOLN
30.0000 mg/h | INTRAVENOUS | Status: DC
  Filled 2015-10-27 (×2): qty 200

## 2015-10-27 MED ORDER — MIDAZOLAM HCL 2 MG/2ML IJ SOLN
INTRAMUSCULAR | Status: DC | PRN
  Administered 2015-10-27 (×3): 1 mg via INTRAVENOUS

## 2015-10-27 MED ORDER — NOREPINEPHRINE BITARTRATE 1 MG/ML IV SOLN
4.0000 mg | INTRAVENOUS | Status: DC | PRN
  Administered 2015-10-27: 2 ug/min via INTRAVENOUS

## 2015-10-27 MED ORDER — NITROPRUSSIDE SODIUM 25 MG/ML IV SOLN
0.0000 ug/kg/min | Freq: Once | INTRAVENOUS | Status: DC
Start: 2015-10-27 — End: 2015-10-28
  Filled 2015-10-27: qty 2

## 2015-10-27 MED ORDER — AMIODARONE HCL 150 MG/3ML IV SOLN
INTRAVENOUS | Status: DC | PRN
  Administered 2015-10-27: 150 mg via INTRAVENOUS

## 2015-10-27 MED ORDER — FENTANYL CITRATE (PF) 100 MCG/2ML IJ SOLN
INTRAMUSCULAR | Status: DC | PRN
  Administered 2015-10-27 (×3): 25 ug via INTRAVENOUS

## 2015-10-27 SURGICAL SUPPLY — 33 items
BALLN EMERGE MR 2.0X20 (BALLOONS) ×2
BALLN EMERGE MR 2.5X15 (BALLOONS) ×2
BALLN EMERGE MR 2.5X30 (BALLOONS) ×2
BALLN EMERGE MR 3.0X30 (BALLOONS) ×1 IMPLANT
BALLN MINITREK OTW 2.0X12 (BALLOONS) ×2
BALLN ~~LOC~~ EMERGE MR 3.0X20 (BALLOONS) ×2
BALLN ~~LOC~~ EMERGE MR 3.5X30 (BALLOONS) ×2
BALLN ~~LOC~~ EMERGE MR 4.0X20 (BALLOONS) ×2
BALLN ~~LOC~~ TREK RX 4.5X20 (BALLOONS) ×2
BALLOON EMERGE MR 2.0X20 (BALLOONS) IMPLANT
BALLOON EMERGE MR 2.5X15 (BALLOONS) IMPLANT
BALLOON EMERGE MR 2.5X30 (BALLOONS) IMPLANT
BALLOON MINITREK OTW 2.0X12 (BALLOONS) IMPLANT
BALLOON ~~LOC~~ EMERGE MR 3.0X20 (BALLOONS) IMPLANT
BALLOON ~~LOC~~ EMERGE MR 3.5X30 (BALLOONS) IMPLANT
BALLOON ~~LOC~~ EMERGE MR 4.0X20 (BALLOONS) IMPLANT
BALLOON ~~LOC~~ TREK RX 4.5X20 (BALLOONS) IMPLANT
CATH EXTRAC PRONTO 5.5F 138CM (CATHETERS) ×1 IMPLANT
CATH INFINITI 5FR MULTPACK ANG (CATHETERS) ×2 IMPLANT
CATH VISTA GUIDE 6FR JR4 (CATHETERS) ×1 IMPLANT
GUIDELINER 6F (CATHETERS) ×1 IMPLANT
KIT ENCORE 26 ADVANTAGE (KITS) ×1 IMPLANT
KIT HEART LEFT (KITS) ×2 IMPLANT
PACK CARDIAC CATHETERIZATION (CUSTOM PROCEDURE TRAY) ×2 IMPLANT
SHEATH PINNACLE 6F 10CM (SHEATH) ×1 IMPLANT
STENT XIENCE ALPINE RX 3.0X38 (Permanent Stent) ×1 IMPLANT
STENT XIENCE ALPINE RX 3.5X38 (Permanent Stent) ×1 IMPLANT
STENT XIENCE ALPINE RX 4.0X18 (Permanent Stent) ×1 IMPLANT
SYR MEDRAD MARK V 150ML (SYRINGE) ×2 IMPLANT
TRANSDUCER W/STOPCOCK (MISCELLANEOUS) ×2 IMPLANT
WIRE EMERALD 3MM-J .035X150CM (WIRE) ×2 IMPLANT
WIRE PT2 MS 185 (WIRE) ×2 IMPLANT
WIRE RUNTHROUGH .014X300CM (WIRE) ×1 IMPLANT

## 2015-10-27 NOTE — ED Provider Notes (Signed)
CSN: [id number]     Arrival date & time 11-21-15  1907 History   First MD Initiated Contact with Patient 2015-11-21 1913     Chief Complaint  Patient presents with  . Multiple complaints      (Consider location/radiation/quality/duration/timing/severity/associated sxs/prior Treatment) HPI Comments: The patient is a 59 year old male, he has a known history of diabetes, hypertension, he has had myocardial infarction twice in the past and has known stents to his heart. He presents after having several days of feeling short of breath, having some vague epigastric discomfort as well as having a feeling of poor appetite, has not been eating very much, has been feeling lightheaded when he stands and is seeing black spots occasionally. This is gradually getting worse, nothing seems to make this better, at this time his symptoms are moderate to severe.  The history is provided by the patient, the spouse and medical records.    Past Medical History  Diagnosis Date  . Coronary artery disease   . Diabetes mellitus   . Hypertension   . Acute MI [facility])    Past Surgical History  Procedure Laterality Date  . Coronary angioplasty with stent placement     Family History  Problem Relation Age of Onset  . Heart failure Other   . Hypertension Other    Social History  Substance Use Topics  . Smoking status: Current Every Day Smoker -- 2.00 packs/day    Types: Cigarettes  . Smokeless tobacco: None  . Alcohol Use: No    Review of Systems  All other systems reviewed and are negative.     Allergies  Review of patient's allergies indicates no known allergies.  Home Medications   Prior to Admission medications   Medication Sig Start Date End Date Taking? Authorizing Provider  aspirin 81 MG tablet Take 81 mg by mouth daily.    Historical Provider, MD  clopidogrel (PLAVIX) 75 MG tablet Take 75 mg by mouth daily.    Historical Provider, MD  furosemide (LASIX) 40 MG tablet Take 1 tablet (40 mg  total) by mouth daily. 08/23/15   Henderson Cloud, MD  glimepiride (AMARYL) 4 MG tablet Take 4 mg by mouth 2 (two) times daily.    Historical Provider, MD  ibuprofen (ADVIL,MOTRIN) 200 MG tablet Take 200 mg by mouth every 6 (six) hours as needed for mild pain or moderate pain.     Historical Provider, MD  lisinopril (PRINIVIL,ZESTRIL) 40 MG tablet Take 40 mg by mouth 2 (two) times daily.    Historical Provider, MD  metFORMIN (GLUCOPHAGE) 500 MG tablet Take 500 mg by mouth 2 (two) times daily.    Historical Provider, MD  metoprolol (LOPRESSOR) 50 MG tablet Take 50 mg by mouth 2 (two) times daily.    Historical Provider, MD  nitroGLYCERIN (NITROSTAT) 0.4 MG SL tablet Place 0.4 mg under the tongue every 5 (five) minutes as needed for chest pain.    Historical Provider, MD  simvastatin (ZOCOR) 40 MG tablet Take 40 mg by mouth at bedtime.    Historical Provider, MD   BP 99/71 mmHg  Pulse 50  Temp(Src) 97.9 F (36.6 C) (Oral)  Resp 24  Ht  (1.905 m)  Wt 210 lb (95.255 kg)  BMI 26.25 kg/m2  SpO2 96% Physical Exam  Constitutional: He appears well-developed and well-nourished. No distress.  HENT:  Head: Normocephalic and atraumatic.  Mouth/Throat: Oropharynx is clear and moist. No oropharyngeal exudate.  Eyes: Conjunctivae and EOM are normal. Pupils  are equal, round, and reactive to light. Right eye exhibits no discharge. Left eye exhibits no discharge. No scleral icterus.  Neck: Normal range of motion. Neck supple. No JVD present. No thyromegaly present.  Cardiovascular: Normal rate, regular rhythm, normal heart sounds and intact distal pulses.  Exam reveals no gallop and no friction rub.   No murmur heard. Frequent ectopy, palpable pulses at the radial arteries, no JVD  Pulmonary/Chest: Effort normal and breath sounds normal. No respiratory distress. He has no wheezes. He has no rales.  Abdominal: Soft. Bowel sounds are normal. He exhibits no distension and no mass. There is no  tenderness.  No abd discomfort to palpation  Musculoskeletal: Normal range of motion. He exhibits no edema or tenderness.  No edema  Lymphadenopathy:    He has no cervical adenopathy.  Neurological: He is alert. Coordination normal.  Skin: Skin is warm and dry. No rash noted. No erythema.  Psychiatric: He has a normal mood and affect. His behavior is normal.  Nursing note and vitals reviewed.   ED Course  Procedures (including critical care time) Labs Review Labs Reviewed  CBC WITH DIFFERENTIAL/PLATELET - Abnormal; Notable for the following:    WBC 16.1 (*)    RBC 4.18 (*)    Hemoglobin 11.6 (*)    HCT 34.0 (*)    Neutro Abs 11.6 (*)    Monocytes Absolute 1.7 (*)    All other components within normal limits  I-STAT TROPOININ, ED - Abnormal; Notable for the following:    Troponin i, poc >35.00 (*)    All other components within normal limits  CBG MONITORING, ED - Abnormal; Notable for the following:    Glucose-Capillary 157 (*)    All other components within normal limits  COMPREHENSIVE METABOLIC PANEL  TROPONIN I  APTT  PROTIME-INR  I-STAT CHEM 8, ED    Imaging Review No results found. I have personally reviewed and evaluated these images and lab results as part of my medical decision-making.   EKG Interpretation   Date/Time:  Saturday October 27 2015 19:24:57 EST Ventricular Rate:  86 PR Interval:  162 QRS Duration: 93 QT Interval:  388 QTC Calculation: 464 R Axis:   -12 Text Interpretation:  Sinus rhythm Inferoposterior infarct, acute (RCA)  Probable RV involvement, suggest recording right precordial leads ** **  ACUTE MI / STEMI ** ** Since last tracing now with ischemia Confirmed by  Irie Dowson  MD, Kytzia Gienger ([id number]) on 2015-02-14 7:52:20 PM      MDM   Final diagnoses:  ST elevation myocardial infarction (STEMI), unspecified artery (HCC)    STEMI activated immediately D/w Dr. Tora PerchesHarawni who has accepted to the cath lab [place] at Dr. Sharyn LullHarwani  request Heparin bolus Fluid bolus for hypotension ASA Cards monitoring  CRITICAL CARE Performed by: Vida RollerMILLER,Edmon Magid D Total critical care time: 35 minutes Critical care time was exclusive of separately billable procedures and treating other patients. Critical care was necessary to treat or prevent imminent or life-threatening deterioration. Critical care was time spent personally by me on the following activities: development of treatment plan with patient and/or surrogate as well as nursing, discussions with consultants, evaluation of patient's response to treatment, examination of patient, obtaining history from patient or surrogate, ordering and performing treatments and interventions, ordering and review of laboratory studies, ordering and review of radiographic studies, pulse oximetry and re-evaluation of patient's condition.     Eber HongBrian Nanci Lakatos, MD Feb 09, 2015 [phone]

## 2015-10-27 NOTE — ED Notes (Signed)
Pt also reporting SOB and abdominal pain.

## 2015-10-27 NOTE — ED Notes (Signed)
Pt reports dry hacking cough, generalized body aches, nausea hot flushing and "spots in front of his eyes."

## 2015-10-27 NOTE — H&P (Signed)
[name] is an 59 y.o. male.   Chief Complaint: Vague left-sided chest discomfort/epigastric pain associated with progressive shortness of breath for last 4 days  HPI: Patient is 58 year old male with past medical history significant for coronary artery disease history of MI in the past status post PTCA stenting to 100% occluded diagonal 2 in the past and PLV branch of RCA, hypertension, diabetes mellitus, history of congestive heart failure secondary to depressed LV systolic function, tobacco abuse, GERD, degenerative joint disease, positive family history of coronary artery disease, peripheral vascular disease, UA was transferred from [facility] as code STEMI was called. Patient complained of vague left-sided chest discomfort associated with epigastric pain and progressive increasing shortness of breath associated with feeling weak malaise no appetite for last 4 days patient did not seek any medical attention as his symptoms got worse so decided to come to the ED EKG done in the ED showed normal sinus rhythm with Q waves and ST elevation in inferior leads and ST depression in V V3 to V6 are and reciprocal changes in lead 1 and aVL suggestive of acute inferoposterior wall MI. Patient was noted to have elevated troponin I about 66. Patient is transferred emergently to [place] for emergency PCI.  Past Medical History  Diagnosis Date  . Coronary artery disease   . Diabetes mellitus   . Hypertension   . Acute MI [facility])     Past Surgical History  Procedure Laterality Date  . Coronary angioplasty with stent placement      Family History  Problem Relation Age of Onset  . Heart failure Other   . Hypertension Other    Social History:  reports that he has been smoking Cigarettes.  He has been smoking about 2.00 packs per day. He does not have any smokeless tobacco history on file. He reports that he does not drink alcohol or use illicit drugs.  Allergies: No Known  Allergies  Medications Prior to Admission  Medication Sig Dispense Refill  . aspirin 81 MG tablet Take 81 mg by mouth daily.    . clopidogrel (PLAVIX) 75 MG tablet Take 75 mg by mouth daily.    . furosemide (LASIX) 40 MG tablet Take 1 tablet (40 mg total) by mouth daily. 30 tablet 2  . glimepiride (AMARYL) 4 MG tablet Take 4 mg by mouth 2 (two) times daily.    Marland Kitchen ibuprofen (ADVIL,MOTRIN) 200 MG tablet Take 200 mg by mouth every 6 (six) hours as needed for mild pain or moderate pain.     Marland Kitchen lisinopril (PRINIVIL,ZESTRIL) 40 MG tablet Take 40 mg by mouth 2 (two) times daily.    . metFORMIN (GLUCOPHAGE) 500 MG tablet Take 500 mg by mouth 2 (two) times daily.    . metoprolol (LOPRESSOR) 50 MG tablet Take 50 mg by mouth 2 (two) times daily.    . nitroGLYCERIN (NITROSTAT) 0.4 MG SL tablet Place 0.4 mg under the tongue every 5 (five) minutes as needed for chest pain.    . simvastatin (ZOCOR) 40 MG tablet Take 40 mg by mouth at bedtime.      Results for orders placed or performed during the hospital encounter of 10/17/2015 (from the past 48 hour(s))  CBG monitoring, ED     Status: Abnormal   Collection Time: 10/28/2015  7:32 PM  Result Value Ref Range   Glucose-Capillary 157 (H) 65 - 99 mg/dL  CBC with Differential     Status: Abnormal   Collection Time: 10/14/2015  7:35 PM  Result Value Ref Range   WBC 16.1 (H) 4.0 - 10.5 K/uL   RBC 4.18 (L) 4.22 - 5.81 MIL/uL   Hemoglobin 11.6 (L) 13.0 - 17.0 g/dL   HCT 34.0 (L) 39.0 - 52.0 %   MCV 81.3 78.0 - 100.0 fL   MCH 27.8 26.0 - 34.0 pg   MCHC 34.1 30.0 - 36.0 g/dL   RDW 14.0 11.5 - 15.5 %   Platelets 221 150 - 400 K/uL   Neutrophils Relative % 72 %   Neutro Abs 11.6 (H) 1.7 - 7.7 K/uL   Lymphocytes Relative 18 %   Lymphs Abs 2.8 0.7 - 4.0 K/uL   Monocytes Relative 10 %   Monocytes Absolute 1.7 (H) 0.1 - 1.0 K/uL   Eosinophils Relative 0 %   Eosinophils Absolute 0.0 0.0 - 0.7 K/uL   Basophils Relative 0 %   Basophils Absolute 0.0 0.0 - 0.1 K/uL   Comprehensive metabolic panel     Status: Abnormal   Collection Time: 10/04/2015  7:35 PM  Result Value Ref Range   Sodium 129 (L) 135 - 145 mmol/L   Potassium 3.9 3.5 - 5.1 mmol/L   Chloride 98 (L) 101 - 111 mmol/L   CO2 22 22 - 32 mmol/L   Glucose, Bld 171 (H) 65 - 99 mg/dL   BUN 16 6 - 20 mg/dL   Creatinine, Ser 1.21 0.61 - 1.24 mg/dL   Calcium 9.1 8.9 - 10.3 mg/dL   Total Protein 7.5 6.5 - 8.1 g/dL   Albumin 3.4 (L) 3.5 - 5.0 g/dL   AST 179 (H) 15 - 41 U/L   ALT 46 17 - 63 U/L   Alkaline Phosphatase 76 38 - 126 U/L   Total Bilirubin 2.0 (H) 0.3 - 1.2 mg/dL   GFR calc non Af Amer >60 >60 mL/min   GFR calc Af Amer >60 >60 mL/min    Comment: (NOTE) The eGFR has been calculated using the CKD EPI equation. This calculation has not been validated in all clinical situations. eGFR's persistently <60 mL/min signify possible Chronic Kidney Disease.    Anion gap 9 5 - 15  Troponin I     Status: Abnormal   Collection Time: 10/30/2015  7:35 PM  Result Value Ref Range   Troponin I 66.56 (HH) <0.031 ng/mL    Comment:        POSSIBLE MYOCARDIAL ISCHEMIA. SERIAL TESTING RECOMMENDED. CRITICAL RESULT CALLED TO, READ BACK BY AND VERIFIED WITH: POINDEXTER M AT 2016 ON [id number] BY FORSYTH K   APTT     Status: None   Collection Time: 10/19/2015  7:35 PM  Result Value Ref Range   aPTT 36 24 - 37 seconds  Protime-INR     Status: Abnormal   Collection Time: 10/31/2015  7:35 PM  Result Value Ref Range   Prothrombin Time 16.2 (H) 11.6 - 15.2 seconds   INR 1.29 0.00 - 1.49  I-Stat Troponin, ED (not at [facility])     Status: Abnormal   Collection Time: 10/15/2015  7:40 PM  Result Value Ref Range   Troponin i, poc >35.00 (HH) 0.00 - 0.08 ng/mL   Comment NOTIFIED PHYSICIAN    Comment 3            Comment: Due to the release kinetics of cTnI, a negative result within the first hours of the onset of symptoms does not rule out myocardial infarction with certainty. If myocardial infarction is still  suspected, repeat the  test at appropriate intervals.   I-Stat Chem 8, ED     Status: Abnormal   Collection Time: 10/12/2015  7:54 PM  Result Value Ref Range   Sodium 131 (L) 135 - 145 mmol/L   Potassium 3.9 3.5 - 5.1 mmol/L   Chloride 96 (L) 101 - 111 mmol/L   BUN 15 6 - 20 mg/dL   Creatinine, Ser 1.10 0.61 - 1.24 mg/dL   Glucose, Bld 163 (H) 65 - 99 mg/dL   Calcium, Ion 1.16 1.12 - 1.23 mmol/L   TCO2 22 0 - 100 mmol/L   Hemoglobin 12.6 (L) 13.0 - 17.0 g/dL   HCT 37.0 (L) 39.0 - 52.0 %   Dg Chest Port 1 View  10/11/2015  CLINICAL DATA:  59 year old male with shortness of breath, nausea and right cough EXAM: PORTABLE CHEST 1 VIEW COMPARISON:  Radiograph dated 08/21/2015 FINDINGS: Single-view of the chest demonstrates enlarged cardiac silhouette with interval increase in size compared to the prior study. While this may be related to patient positioning and suboptimal respiratory effort, other etiologies including pericardial effusion are not excluded. Clinical correlation is recommended. Frontal lateral views of the chest echocardiogram may provide better evaluation. There is no focal consolidation, pleural effusion, or pneumothorax. The osseous structures appear unremarkable. IMPRESSION: No focal consolidation. Apparent increase in the cardiac silhouette compared to the study dated 08/21/2015. Frontal lateral views of the chest may provide better evaluation. Electronically Signed   By: Anner Crete M.D.   On: 10/22/2015 19:58    Review of Systems  Constitutional: Positive for malaise/fatigue.  Respiratory: Positive for shortness of breath.   Cardiovascular: Positive for chest pain.  Gastrointestinal: Positive for nausea and abdominal pain.  Genitourinary: Negative for dysuria.  Neurological: Positive for dizziness and weakness.    Blood pressure 99/71, pulse 50, temperature 97.9 F (36.6 C), temperature source Oral, resp. rate 24, height '6\' 3"'$  (1.905 m), weight 210 lb (95.255 kg),  SpO2 96 %. Physical Exam  Constitutional: He is oriented to person, place, and time.  Eyes: Conjunctivae are normal. Pupils are equal, round, and reactive to light. Left eye exhibits no discharge. No scleral icterus.  Neck: Normal range of motion. Neck supple. No JVD present. No tracheal deviation present. No thyromegaly present.  Cardiovascular: Normal rate and regular rhythm.   Murmur (Soft systolic murmur and S4 gallop noted) heard. Respiratory:  Clear anterolaterally  GI: Soft. Bowel sounds are normal. He exhibits no distension. There is no tenderness. There is no rebound.  Musculoskeletal: He exhibits no edema or tenderness.  Neurological: He is alert and oriented to person, place, and time.     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 death MI stroke need, density CABG local vascular complications and consents for PCI  Charolette Forward 10/26/2015, 11:29 PM

## 2015-10-27 NOTE — ED Notes (Signed)
CRITICAL VALUE ALERT  Critical value received:  Trop. > 35  Date of notification:  10/13/2015  Time of notification:  19:52  Critical value read back: yes Nurse who received alert:  Youlanda MightyMelinda Vickee Mormino RN   MD notified (1st page):  Dr Hyacinth MeekerMiller Time of first page:  19:52  MD notified (2nd page):  Time of second page:  Responding MD:  Dr Hyacinth MeekerMiller  Time MD responded: 19:52

## 2015-10-27 NOTE — ED Notes (Signed)
Lab called with critical trop level of 66.56 at 20:15, attempted to call cath lab to report trop level with no answer, Dr Hyacinth MeekerMiller notified of trop level, no additional orders given,

## 2015-10-28 ENCOUNTER — Encounter (HOSPITAL_COMMUNITY): Admission: EM | Disposition: E | Payer: Self-pay | Source: Home / Self Care | Attending: Cardiology

## 2015-10-28 ENCOUNTER — Inpatient Hospital Stay (HOSPITAL_COMMUNITY): Payer: BLUE CROSS/BLUE SHIELD | Admitting: Anesthesiology

## 2015-10-28 ENCOUNTER — Inpatient Hospital Stay (HOSPITAL_COMMUNITY): Payer: BLUE CROSS/BLUE SHIELD

## 2015-10-28 ENCOUNTER — Encounter (HOSPITAL_COMMUNITY): Payer: Self-pay | Admitting: Anesthesiology

## 2015-10-28 ENCOUNTER — Other Ambulatory Visit (HOSPITAL_COMMUNITY): Payer: BLUE CROSS/BLUE SHIELD

## 2015-10-28 DIAGNOSIS — N179 Acute kidney failure, unspecified: Secondary | ICD-10-CM | POA: Diagnosis not present

## 2015-10-28 DIAGNOSIS — E872 Acidosis: Secondary | ICD-10-CM | POA: Diagnosis not present

## 2015-10-28 DIAGNOSIS — T508X5A Adverse effect of diagnostic agents, initial encounter: Secondary | ICD-10-CM | POA: Diagnosis not present

## 2015-10-28 DIAGNOSIS — D62 Acute posthemorrhagic anemia: Secondary | ICD-10-CM | POA: Diagnosis not present

## 2015-10-28 DIAGNOSIS — Z7982 Long term (current) use of aspirin: Secondary | ICD-10-CM | POA: Diagnosis not present

## 2015-10-28 DIAGNOSIS — I119 Hypertensive heart disease without heart failure: Secondary | ICD-10-CM | POA: Diagnosis present

## 2015-10-28 DIAGNOSIS — F17210 Nicotine dependence, cigarettes, uncomplicated: Secondary | ICD-10-CM | POA: Diagnosis present

## 2015-10-28 DIAGNOSIS — I2111 ST elevation (STEMI) myocardial infarction involving right coronary artery: Secondary | ICD-10-CM | POA: Diagnosis present

## 2015-10-28 DIAGNOSIS — I255 Ischemic cardiomyopathy: Secondary | ICD-10-CM | POA: Diagnosis present

## 2015-10-28 DIAGNOSIS — E1151 Type 2 diabetes mellitus with diabetic peripheral angiopathy without gangrene: Secondary | ICD-10-CM | POA: Diagnosis present

## 2015-10-28 DIAGNOSIS — F17200 Nicotine dependence, unspecified, uncomplicated: Secondary | ICD-10-CM | POA: Diagnosis present

## 2015-10-28 DIAGNOSIS — I2119 ST elevation (STEMI) myocardial infarction involving other coronary artery of inferior wall: Secondary | ICD-10-CM | POA: Diagnosis present

## 2015-10-28 DIAGNOSIS — I442 Atrioventricular block, complete: Secondary | ICD-10-CM | POA: Diagnosis not present

## 2015-10-28 DIAGNOSIS — M1990 Unspecified osteoarthritis, unspecified site: Secondary | ICD-10-CM | POA: Diagnosis present

## 2015-10-28 DIAGNOSIS — Z955 Presence of coronary angioplasty implant and graft: Secondary | ICD-10-CM | POA: Diagnosis not present

## 2015-10-28 DIAGNOSIS — Z7984 Long term (current) use of oral hypoglycemic drugs: Secondary | ICD-10-CM | POA: Diagnosis not present

## 2015-10-28 DIAGNOSIS — R570 Cardiogenic shock: Secondary | ICD-10-CM | POA: Diagnosis present

## 2015-10-28 DIAGNOSIS — Z8249 Family history of ischemic heart disease and other diseases of the circulatory system: Secondary | ICD-10-CM | POA: Diagnosis not present

## 2015-10-28 DIAGNOSIS — I213 ST elevation (STEMI) myocardial infarction of unspecified site: Secondary | ICD-10-CM | POA: Diagnosis present

## 2015-10-28 DIAGNOSIS — I2510 Atherosclerotic heart disease of native coronary artery without angina pectoris: Secondary | ICD-10-CM | POA: Diagnosis present

## 2015-10-28 DIAGNOSIS — J9600 Acute respiratory failure, unspecified whether with hypoxia or hypercapnia: Secondary | ICD-10-CM | POA: Diagnosis not present

## 2015-10-28 DIAGNOSIS — I252 Old myocardial infarction: Secondary | ICD-10-CM | POA: Diagnosis not present

## 2015-10-28 DIAGNOSIS — Z7902 Long term (current) use of antithrombotics/antiplatelets: Secondary | ICD-10-CM | POA: Diagnosis not present

## 2015-10-28 DIAGNOSIS — I237 Postinfarction angina: Secondary | ICD-10-CM | POA: Diagnosis present

## 2015-10-28 DIAGNOSIS — K219 Gastro-esophageal reflux disease without esophagitis: Secondary | ICD-10-CM | POA: Diagnosis present

## 2015-10-28 HISTORY — PX: CARDIAC CATHETERIZATION: SHX172

## 2015-10-28 LAB — COMPREHENSIVE METABOLIC PANEL
ALBUMIN: 2.6 g/dL — AB (ref 3.5–5.0)
ALK PHOS: 73 U/L (ref 38–126)
ALT: 42 U/L (ref 17–63)
ANION GAP: 14 (ref 5–15)
AST: 154 U/L — ABNORMAL HIGH (ref 15–41)
BUN: 14 mg/dL (ref 6–20)
CALCIUM: 7.8 mg/dL — AB (ref 8.9–10.3)
CO2: 13 mmol/L — AB (ref 22–32)
Chloride: 102 mmol/L (ref 101–111)
Creatinine, Ser: 1.24 mg/dL (ref 0.61–1.24)
GFR calc Af Amer: >60 mL/min (ref >60)
GFR calc non Af Amer: >60 mL/min (ref >60)
GLUCOSE: 232 mg/dL — AB (ref 65–99)
POTASSIUM: 4.2 mmol/L (ref 3.5–5.1)
SODIUM: 129 mmol/L — AB (ref 135–145)
Total Bilirubin: 2 mg/dL — ABNORMAL HIGH (ref 0.3–1.2)
Total Protein: 6.3 g/dL — ABNORMAL LOW (ref 6.5–8.1)

## 2015-10-28 LAB — CBC
HCT: 29.3 % — ABNORMAL LOW (ref 39.0–52.0)
HEMOGLOBIN: 10.1 g/dL — AB (ref 13.0–17.0)
MCH: 28.1 pg (ref 26.0–34.0)
MCHC: 34.5 g/dL (ref 30.0–36.0)
MCV: 81.4 fL (ref 78.0–100.0)
Platelets: 190 10*3/uL (ref 150–400)
RBC: 3.6 MIL/uL — ABNORMAL LOW (ref 4.22–5.81)
RDW: 14.2 % (ref 11.5–15.5)
WBC: 18.6 10*3/uL — ABNORMAL HIGH (ref 4.0–10.5)

## 2015-10-28 LAB — CBC WITH DIFFERENTIAL/PLATELET
BASOS ABS: 0 10*3/uL (ref 0.0–0.1)
BASOS PCT: 0 %
Eosinophils Absolute: 0 10*3/uL (ref 0.0–0.7)
Eosinophils Relative: 0 %
HEMATOCRIT: 29.7 % — AB (ref 39.0–52.0)
HEMOGLOBIN: 10.3 g/dL — AB (ref 13.0–17.0)
Lymphocytes Relative: 9 %
Lymphs Abs: 1.7 10*3/uL (ref 0.7–4.0)
MCH: 28.1 pg (ref 26.0–34.0)
MCHC: 34.7 g/dL (ref 30.0–36.0)
MCV: 81.1 fL (ref 78.0–100.0)
Monocytes Absolute: 1.5 10*3/uL — ABNORMAL HIGH (ref 0.1–1.0)
Monocytes Relative: 8 %
NEUTROS ABS: 15.9 10*3/uL — AB (ref 1.7–7.7)
NEUTROS PCT: 83 %
Platelets: 226 10*3/uL (ref 150–400)
RBC: 3.66 MIL/uL — ABNORMAL LOW (ref 4.22–5.81)
RDW: 14.2 % (ref 11.5–15.5)
WBC: 19.2 10*3/uL — ABNORMAL HIGH (ref 4.0–10.5)

## 2015-10-28 LAB — BASIC METABOLIC PANEL
ANION GAP: 15 (ref 5–15)
BUN: 16 mg/dL (ref 6–20)
CALCIUM: 7.8 mg/dL — AB (ref 8.9–10.3)
CO2: 10 mmol/L — ABNORMAL LOW (ref 22–32)
Chloride: 105 mmol/L (ref 101–111)
Creatinine, Ser: 1.43 mg/dL — ABNORMAL HIGH (ref 0.61–1.24)
GFR, EST NON AFRICAN AMERICAN: 53 mL/min — AB (ref >60)
GLUCOSE: 194 mg/dL — AB (ref 65–99)
Potassium: 4.6 mmol/L (ref 3.5–5.1)
SODIUM: 130 mmol/L — AB (ref 135–145)

## 2015-10-28 LAB — POCT I-STAT 3, ART BLOOD GAS (G3+)
Acid-base deficit: 28 mmol/L — ABNORMAL HIGH (ref 0.0–2.0)
Bicarbonate: 4.1 mEq/L — ABNORMAL LOW (ref 20.0–24.0)
O2 Saturation: 91 %
PCO2 ART: 21.2 mmHg — AB (ref 35.0–45.0)
PH ART: 6.891 — AB (ref 7.350–7.450)
Patient temperature: 97
pO2, Arterial: 99 mmHg (ref 80.0–100.0)

## 2015-10-28 LAB — TROPONIN I
TROPONIN I: 30.64 ng/mL — AB (ref <0.031)
TROPONIN I: 24.45 ng/mL — AB (ref <0.031)
Troponin I: 32.6 ng/mL (ref <0.031)

## 2015-10-28 LAB — GLUCOSE, CAPILLARY
GLUCOSE-CAPILLARY: 196 mg/dL — AB (ref 65–99)
GLUCOSE-CAPILLARY: 144 mg/dL — AB (ref 65–99)
Glucose-Capillary: 178 mg/dL — ABNORMAL HIGH (ref 65–99)

## 2015-10-28 LAB — POCT ACTIVATED CLOTTING TIME
ACTIVATED CLOTTING TIME: 214 s
Activated Clotting Time: 270 seconds

## 2015-10-28 LAB — MAGNESIUM: Magnesium: 1.8 mg/dL (ref 1.7–2.4)

## 2015-10-28 LAB — MRSA PCR SCREENING: MRSA by PCR: NEGATIVE

## 2015-10-28 SURGERY — TEMPORARY PACEMAKER
Anesthesia: LOCAL

## 2015-10-28 MED ORDER — MORPHINE SULFATE (PF) 2 MG/ML IV SOLN
2.0000 mg | INTRAVENOUS | Status: DC | PRN
  Administered 2015-10-28: 2 mg via INTRAVENOUS
  Filled 2015-10-28: qty 1

## 2015-10-28 MED ORDER — SODIUM CHLORIDE 0.9 % IV SOLN: 250.0000 mL | INTRAVENOUS | Status: DC | PRN

## 2015-10-28 MED ORDER — HEPARIN (PORCINE) IN NACL 2-0.9 UNIT/ML-% IJ SOLN
INTRAMUSCULAR | Status: AC
  Filled 2015-10-28: qty 500

## 2015-10-28 MED ORDER — SODIUM CHLORIDE 0.9 % IV SOLN
INTRAVENOUS | Status: DC | PRN
Start: 2015-10-28 — End: 2015-10-28
  Administered 2015-10-28: 10 mL/h via INTRAVENOUS

## 2015-10-28 MED ORDER — LIDOCAINE HCL (PF) 1 % IJ SOLN
INTRAMUSCULAR | Status: AC
  Filled 2015-10-28: qty 30

## 2015-10-28 MED ORDER — FUROSEMIDE 10 MG/ML IJ SOLN: 40.0000 mg | Freq: Once | INTRAMUSCULAR | Status: DC

## 2015-10-28 MED ORDER — FENTANYL CITRATE (PF) 100 MCG/2ML IJ SOLN: 100.0000 ug | INTRAMUSCULAR | Status: DC | PRN

## 2015-10-28 MED ORDER — DOPAMINE-DEXTROSE 3.2-5 MG/ML-% IV SOLN
0.0000 ug/kg/min | INTRAVENOUS | Status: DC
  Administered 2015-10-28: 5 ug/kg/min via INTRAVENOUS

## 2015-10-28 MED ORDER — TIROFIBAN HCL IN NACL 5-0.9 MG/100ML-% IV SOLN
0.1500 ug/kg/min | INTRAVENOUS | Status: AC
  Administered 2015-10-28 (×3): 0.15 ug/kg/min via INTRAVENOUS
  Filled 2015-10-28 (×3): qty 100

## 2015-10-28 MED ORDER — AMIODARONE HCL IN DEXTROSE 360-4.14 MG/200ML-% IV SOLN
60.0000 mg/h | INTRAVENOUS | Status: DC
  Administered 2015-10-28: 60 mg/h via INTRAVENOUS

## 2015-10-28 MED ORDER — FUROSEMIDE 10 MG/ML IJ SOLN
INTRAMUSCULAR | Status: AC
  Filled 2015-10-28: qty 4

## 2015-10-28 MED ORDER — FENTANYL CITRATE (PF) 100 MCG/2ML IJ SOLN
INTRAMUSCULAR | Status: AC
  Administered 2015-10-28: 50 ug
  Filled 2015-10-28: qty 4

## 2015-10-28 MED ORDER — DOPAMINE-DEXTROSE 3.2-5 MG/ML-% IV SOLN
INTRAVENOUS | Status: AC
  Filled 2015-10-28: qty 250

## 2015-10-28 MED ORDER — ATORVASTATIN CALCIUM 80 MG PO TABS
80.0000 mg | ORAL_TABLET | Freq: Every day | ORAL | Status: DC
  Administered 2015-10-28: 80 mg via ORAL
  Filled 2015-10-28: qty 1

## 2015-10-28 MED ORDER — ALPRAZOLAM 0.25 MG PO TABS
0.2500 mg | ORAL_TABLET | Freq: Two times a day (BID) | ORAL | Status: DC | PRN
  Administered 2015-10-28 (×3): 0.25 mg via ORAL
  Filled 2015-10-28 (×3): qty 1

## 2015-10-28 MED ORDER — SODIUM BICARBONATE 8.4 % IV SOLN
INTRAVENOUS | Status: DC
  Administered 2015-10-29: via INTRAVENOUS
  Filled 2015-10-28 (×2): qty 150

## 2015-10-28 MED ORDER — SODIUM BICARBONATE 8.4 % IV SOLN
INTRAVENOUS | Status: AC
  Administered 2015-10-28: 100 meq via INTRAVENOUS
  Filled 2015-10-28: qty 100

## 2015-10-28 MED ORDER — PANTOPRAZOLE SODIUM 40 MG PO TBEC
40.0000 mg | DELAYED_RELEASE_TABLET | Freq: Every day | ORAL | Status: DC
  Administered 2015-10-28: 40 mg via ORAL
  Filled 2015-10-28: qty 1

## 2015-10-28 MED ORDER — ASPIRIN 81 MG PO CHEW: 324.0000 mg | CHEWABLE_TABLET | ORAL | Status: DC

## 2015-10-28 MED ORDER — NITROGLYCERIN 0.4 MG SL SUBL: 0.4000 mg | SUBLINGUAL_TABLET | SUBLINGUAL | Status: DC | PRN

## 2015-10-28 MED ORDER — MIDAZOLAM HCL 2 MG/2ML IJ SOLN
INTRAMUSCULAR | Status: AC
  Administered 2015-10-28: 2 mg
  Filled 2015-10-28: qty 4

## 2015-10-28 MED ORDER — ASPIRIN EC 81 MG PO TBEC: 81.0000 mg | DELAYED_RELEASE_TABLET | Freq: Every day | ORAL | Status: DC

## 2015-10-28 MED ORDER — CETYLPYRIDINIUM CHLORIDE 0.05 % MT LIQD
7.0000 mL | Freq: Two times a day (BID) | OROMUCOSAL | Status: DC
  Administered 2015-10-28: 7 mL via OROMUCOSAL

## 2015-10-28 MED ORDER — NITROGLYCERIN IN D5W 200-5 MCG/ML-% IV SOLN: 5.0000 ug/min | INTRAVENOUS | Status: DC

## 2015-10-28 MED ORDER — ASPIRIN 300 MG RE SUPP: 300.0000 mg | RECTAL | Status: DC

## 2015-10-28 MED ORDER — MIDAZOLAM HCL 2 MG/2ML IJ SOLN: 2.0000 mg | INTRAMUSCULAR | Status: DC | PRN

## 2015-10-28 MED ORDER — LIDOCAINE HCL (PF) 1 % IJ SOLN
INTRAMUSCULAR | Status: DC | PRN
  Administered 2015-10-28: 08:00:00

## 2015-10-28 MED ORDER — SODIUM CHLORIDE 0.9 % IJ SOLN
3.0000 mL | Freq: Two times a day (BID) | INTRAMUSCULAR | Status: DC
  Administered 2015-10-28: 3 mL via INTRAVENOUS

## 2015-10-28 MED ORDER — TICAGRELOR 90 MG PO TABS
90.0000 mg | ORAL_TABLET | Freq: Two times a day (BID) | ORAL | Status: DC
  Administered 2015-10-28: 90 mg via ORAL
  Filled 2015-10-28: qty 1

## 2015-10-28 MED ORDER — SODIUM CHLORIDE 0.9 % WEIGHT BASED INFUSION
1.0000 mL/kg/h | INTRAVENOUS | Status: AC
  Administered 2015-10-28: 1 mL/kg/h via INTRAVENOUS

## 2015-10-28 MED ORDER — ATROPINE SULFATE 0.1 MG/ML IJ SOLN
INTRAMUSCULAR | Status: AC
  Filled 2015-10-28: qty 10

## 2015-10-28 MED ORDER — ETOMIDATE 2 MG/ML IV SOLN
20.0000 mg | Freq: Once | INTRAVENOUS | Status: AC
  Administered 2015-10-28: 10 mg via INTRAVENOUS

## 2015-10-28 MED ORDER — FUROSEMIDE 10 MG/ML IJ SOLN
40.0000 mg | Freq: Once | INTRAMUSCULAR | Status: AC
  Administered 2015-10-28: 40 mg via INTRAVENOUS

## 2015-10-28 MED ORDER — SODIUM BICARBONATE 8.4 % IV SOLN
100.0000 meq | Freq: Once | INTRAVENOUS | Status: AC
  Administered 2015-10-28: 100 meq via INTRAVENOUS

## 2015-10-28 MED ORDER — NOREPINEPHRINE BITARTRATE 1 MG/ML IV SOLN
2.0000 ug/min | INTRAVENOUS | Status: DC
  Administered 2015-10-28: 10 ug/min via INTRAVENOUS
  Filled 2015-10-28: qty 4

## 2015-10-28 MED ORDER — ACETAMINOPHEN 325 MG PO TABS
650.0000 mg | ORAL_TABLET | ORAL | Status: DC | PRN
Start: 2015-10-28 — End: 2015-10-29

## 2015-10-28 MED ORDER — SODIUM CHLORIDE 0.9 % IJ SOLN: 3.0000 mL | INTRAMUSCULAR | Status: DC | PRN

## 2015-10-28 MED ORDER — CARVEDILOL 3.125 MG PO TABS: 3.1250 mg | ORAL_TABLET | Freq: Two times a day (BID) | ORAL | Status: DC

## 2015-10-28 MED ORDER — INSULIN ASPART 100 UNIT/ML ~~LOC~~ SOLN
0.0000 [IU] | Freq: Three times a day (TID) | SUBCUTANEOUS | Status: DC
  Administered 2015-10-28: 2 [IU] via SUBCUTANEOUS
  Administered 2015-10-28: 1 [IU] via SUBCUTANEOUS

## 2015-10-28 MED ORDER — CEFAZOLIN SODIUM-DEXTROSE 2-3 GM-% IV SOLR
2.0000 g | Freq: Once | INTRAVENOUS | Status: AC
  Administered 2015-10-28: 2 g via INTRAVENOUS
  Filled 2015-10-28: qty 50

## 2015-10-28 SURGICAL SUPPLY — 4 items
CATH S G BIP PACING (SET/KITS/TRAYS/PACK) ×1 IMPLANT
PACK CARDIAC CATHETERIZATION (CUSTOM PROCEDURE TRAY) ×1 IMPLANT
SHEATH PINNACLE 6F 10CM (SHEATH) ×1 IMPLANT
SLEEVE REPOSITIONING LENGTH 30 (MISCELLANEOUS) ×1 IMPLANT

## 2015-10-28 NOTE — Progress Notes (Signed)
Removed right femoral sheath with Ellery Plunkyan RN. Held pressure for 20 minutes. Site is Level 0 with no hematoma, bruising or bleeding. Education on site restrictions and bleeding precautions given to pt and pt's wife. Pt verbalized and demonstrated understanding. Will continue to monitor.

## 2015-10-28 NOTE — Transfer of Care (Signed)
Immediate Anesthesia Transfer of Care Note  Patient: [name]  Procedure(s) Performed: * No procedures listed *  Patient Location: ICU  Anesthesia Type:General  Level of Consciousness: pateint uncooperative and Patient remains intubated per anesthesia plan  Airway & Oxygen Therapy: Patient remains intubated per anesthesia plan and Patient placed on Ventilator (see vital sign flow sheet for setting)  Post-op Assessment: Report given to RN and Post -op Vital signs reviewed and stable  Post vital signs: Reviewed  Last Vitals:  Filed Vitals:   11-10-2014 1900 11-10-2014 2000  BP: 96/49 90/55  Pulse: 48 53  Temp:    Resp: 21 22    Complications: No apparent anesthesia complications

## 2015-10-28 NOTE — Progress Notes (Signed)
eLink Physician-Brief Progress Note Patient Name: Luis StarchLorenza Mccoll DOB: 02-10-1956 MRN: [id number]   Date of Service  10/10/2015  HPI/Events of Note  Sudden change in mental status Bradycardia, hypotensive  eICU Interventions  Stat call to anesthesia for intubation ABG now increase dopamine now Bedside MD to evaluate Stat labs CBC, BMET,      Intervention Category Major Interventions: Change in mental status - evaluation and management;Arrhythmia - evaluation and management  Max FickleDouglas McQuaid 10/17/2015, 11:13 PM

## 2015-10-28 NOTE — Progress Notes (Signed)
eLink Physician-Brief Progress Note Patient Name: Luis StarchLorenza [name] DOB: January 04, 1956 MRN: [id number]   Date of Service  10/23/2015  HPI/Events of Note  Successfully intubated by anesthesia Significant acidosis Notable change in rhythm> frequent PVC's  eICU Interventions  Stat labs Restart amiodarone infusion Give bicarb now     Intervention Category Major Interventions: Arrhythmia - evaluation and management  Max FickleDouglas McQuaid 10/09/2015, 11:33 PM

## 2015-10-28 NOTE — Anesthesia Procedure Notes (Signed)
Procedure Name: Intubation Date/Time: 06-08-2015 11:29 PM Performed by: Brien MatesMAHONY, Roselyn Doby D Pre-anesthesia Checklist: Patient identified, Emergency Drugs available, Suction available, Patient being monitored and Timeout performed Patient Re-evaluated:Patient Re-evaluated prior to inductionOxygen Delivery Method: Circle system utilized Preoxygenation: Pre-oxygenation with 100% oxygen Laryngoscope Size: Glidescope Grade View: Grade I Tube type: Subglottic suction tube Tube size: 8.0 mm Number of attempts: 1 Airway Equipment and Method: Stylet and Video-laryngoscopy Placement Confirmation: ETT inserted through vocal cords under direct vision,  CO2 detector and breath sounds checked- equal and bilateral Secured at: 23 cm Tube secured with: Tape Dental Injury: Teeth and Oropharynx as per pre-operative assessment

## 2015-10-28 NOTE — Progress Notes (Signed)
Subjective:  Called to see patient as he became hypotensive bradycardic with complete heart block and wide complex escape rhythm. Patient is off Levophed and amiodarone was started on dopamine with improvement in heart rate in 50s pressure 120s 130 systolic but remains in complete heart block with wide QRS complex escape rhythm. Patient denies any chest pain but complains of mild shortness of breath and O2 sats 100% on room air  Objective:  Vital Signs in the last 24 hours: Temp:  [97.9 F (36.6 C)] 97.9 F (36.6 C) (12/24 1915) Pulse Rate:  [0-234] 55 (12/25 0645) Resp:  [0-42] 22 (12/25 0645) BP: (80-154)/(46-100) 135/70 mmHg (12/25 0645) SpO2:  [0 %-100 %] 100 % (12/25 0645) Arterial Line BP: (98-131)/(52-75) 104/57 mmHg (12/25 0405) Weight:  [210 lb (95.255 kg)] 210 lb (95.255 kg) (12/24 1915)  Intake/Output from previous day: 12/24 0701 - 12/25 0700 In: 159.2 [I.V.:159.2] Out: 525 [Urine:525] Intake/Output from this shift: Total I/O In: 159.2 [I.V.:159.2] Out: 525 [Urine:525]  Physical Exam: Neck: no adenopathy, no carotid bruit, no JVD and supple, symmetrical, trachea midline Lungs: Decreased breath sound at bases Heart: Decreased breath sound at bases Abdomen: soft, non-tender; bowel sounds normal; no masses,  no organomegaly Extremities: extremities normal, atraumatic, no cyanosis or edema  Lab Results:  Recent Labs  10/11/2015 0128 10/15/2015 0500  WBC 19.2* 18.6*  HGB 10.3* 10.1*  PLT 226 190    Recent Labs  10/10/2015 0128 10/20/2015 0500  NA 129* 130*  K 4.2 4.6  CL 102 105  CO2 13* 10*  GLUCOSE 232* 194*  BUN 14 16  CREATININE 1.24 1.43*    Recent Labs  09/29/15 1935 10/18/2015 0128  TROPONINI 66.56* 32.60*   Hepatic Function Panel  Recent Labs  10/27/2015 0128  PROT 6.3*  ALBUMIN 2.6*  AST 154*  ALT 42  ALKPHOS 73  BILITOT 2.0*   No results for input(s): CHOL in the last 72 hours. No results for input(s): PROTIME in the last 72  hours.  Imaging: Imaging results have been reviewed and Dg Chest Port 1 View  29-May-2015  CLINICAL DATA:  59 year old male with shortness of breath, nausea and right cough EXAM: PORTABLE CHEST 1 VIEW COMPARISON:  Radiograph dated 08/21/2015 FINDINGS: Single-view of the chest demonstrates enlarged cardiac silhouette with interval increase in size compared to the prior study. While this may be related to patient positioning and suboptimal respiratory effort, other etiologies including pericardial effusion are not excluded. Clinical correlation is recommended. Frontal lateral views of the chest echocardiogram may provide better evaluation. There is no focal consolidation, pleural effusion, or pneumothorax. The osseous structures appear unremarkable. IMPRESSION: No focal consolidation. Apparent increase in the cardiac silhouette compared to the study dated 08/21/2015. Frontal lateral views of the chest may provide better evaluation. Electronically Signed   By: Elgie CollardArash  Radparvar M.D.   On: 29-May-2015 19:58    Cardiac Studies:  Assessment/Plan:  Evolving large inferoposterolateral myocardial infarction status post PCI to RCA, flow could not be reestablished. Due to large organized thrombus burden Complete heart block secondary to above Multivessel CAD Ischemic cardiomyopathy Hypertension Diabetes mellitus Acute anemia secondary to blood loss during PCI Tobacco abuse GERD Peripheral vascular disease Positive family history of coronary artery disease Degenerative joint disease Plan Discussed with patient and his wife regarding temporary transvenous pacemaker its risk and benefits and consents for the procedure    LOS: 0 days    Rinaldo CloudHarwani, Jaeceon Michelin 11/02/2015, 6:51 AM

## 2015-10-28 NOTE — Progress Notes (Signed)
Subjective:  Appreciate CCM help. Patient found unresponsive in room and hypotensive with occasional non-capture on the monitor noted to have marked acidosis requiring intubation. Patient was also noted to have frequent PVCs on the monitor requiring IV amiodarone. Received 2 ampules of bicarbonate and being started on bicarbonate drip. Patient now on ventilator unresponsive on 2 pressors.  Objective:  Vital Signs in the last 24 hours: Temp:  [97 F (36.1 C)-97.3 F (36.3 C)] 97 F (36.1 C) (12/25 1700) Pulse Rate:  [0-295] 53 (12/25 2000) Resp:  [0-50] 22 (12/25 2000) BP: (80-142)/(38-108) 90/55 mmHg (12/25 2000) SpO2:  [0 %-100 %] 85 % (12/25 2000) Arterial Line BP: (98-131)/(52-75) 104/57 mmHg (12/25 0405) FiO2 (%):  [40 %] 40 % (12/25 2320)  Intake/Output from previous day: 12/24 0701 - 12/25 0700 In: 159.2 [I.V.:159.2] Out: 525 [Urine:525] Intake/Output from this shift: Total I/O In: 17.1 [I.V.:17.1] Out: -   Physical Exam: Neck: no adenopathy, no carotid bruit, no JVD and supple, symmetrical, trachea midline Lungs: Clear to auscultation anterolaterally Heart: regular rate and rhythm, S1, S2 normal and Soft systolic murmur noted Abdomen: soft, non-tender; bowel sounds normal; no masses,  no organomegaly Extremities: extremities normal, atraumatic, no cyanosis or edema  Lab Results:  Recent Labs  10/16/2015 0128 11/03/2015 0500  WBC 19.2* 18.6*  HGB 10.3* 10.1*  PLT 226 190    Recent Labs  10/24/2015 0128 10/22/2015 0500  NA 129* 130*  K 4.2 4.6  CL 102 105  CO2 13* 10*  GLUCOSE 232* 194*  BUN 14 16  CREATININE 1.24 1.43*    Recent Labs  10/19/2015 0623 10/17/2015 1143  TROPONINI 30.64* 24.45*   Hepatic Function Panel  Recent Labs  10/19/2015 0128  PROT 6.3*  ALBUMIN 2.6*  AST 154*  ALT 42  ALKPHOS 73  BILITOT 2.0*   No results for input(s): CHOL in the last 72 hours. No results for input(s): PROTIME in the last 72 hours.  Imaging: Imaging results  have been reviewed and Portable Chest Xray  11/02/2015  CLINICAL DATA:  Acute onset of respiratory failure and hypoxia. Endotracheal tube placement. Initial encounter. EXAM: PORTABLE CHEST 1 VIEW COMPARISON:  Chest radiograph performed earlier today at 9:03 a.m. FINDINGS: The patient's endotracheal tube is seen ending 9 cm above the carina. This could be advanced approximately 6 cm. The lungs appear relatively clear. No pleural effusion or pneumothorax is seen. The cardiomediastinal silhouette is enlarged. No acute osseous abnormalities are identified. An external pacing pad is noted. IMPRESSION: 1. Endotracheal tube seen ending 9 cm above the carina. This could be advanced approximately 6 cm. 2. Lungs relatively clear. 3. Cardiomegaly noted. These results were called by telephone at the time of interpretation on 10/20/2015 at 11:56 pm to [facility] RN on [facility], who verbally acknowledged these results. Electronically Signed   By: Roanna Raider M.D.   On: 10/12/2015 23:57   Dg Chest Port 1 View  10/24/2015  CLINICAL DATA:  Pacemaker placement today EXAM: PORTABLE CHEST 1 VIEW COMPARISON:  11/05/2015 FINDINGS: The heart size and mediastinal contours are stable. The heart size is enlarged. Radiopaque wire/ question pacer is projected over the heart. There is no pneumothorax There is no focal infiltrate, pulmonary edema, or pleural effusion. The visualized skeletal structures are unremarkable. IMPRESSION: No active cardiopulmonary disease. Electronically Signed   By: Sherian Rein M.D.   On: 10/30/2015 10:27   Dg Chest Port 1 View  11-05-15  CLINICAL DATA:  59 year old male with shortness of  breath, nausea and right cough EXAM: PORTABLE CHEST 1 VIEW COMPARISON:  Radiograph dated 08/21/2015 FINDINGS: Single-view of the chest demonstrates enlarged cardiac silhouette with interval increase in size compared to the prior study. While this may be related to patient positioning and suboptimal respiratory  effort, other etiologies including pericardial effusion are not excluded. Clinical correlation is recommended. Frontal lateral views of the chest echocardiogram may provide better evaluation. There is no focal consolidation, pleural effusion, or pneumothorax. The osseous structures appear unremarkable. IMPRESSION: No focal consolidation. Apparent increase in the cardiac silhouette compared to the study dated 08/21/2015. Frontal lateral views of the chest may provide better evaluation. Electronically Signed   By: Elgie CollardArash  Radparvar M.D.   On: 10/14/2015 19:58    Cardiac Studies:  Assessment/Plan:  Cardiogenic shock secondary to large inferoposterolateral wall myocardial infarction/non-capture of the pacemaker Status post complete heart block status post temporary pacemaker earlier this a.m.  Acute respiratory failure secondary to above Multivessel CAD Ischemic cardiomyopathy Hypertension Diabetes mellitus Acute renal injury secondary to contrast/hypotension Acute anemia secondary to blood loss during PCI Tobacco abuse GERD Peripheral vascular disease Positive family history of coronary artery disease Degenerative joint disease Plan Temporary pacemaker wire adjusted with one-to-one Now Rate at 60. Continue Present Management per CCM Wean off Dopamine As Blood Pressure Tolerates Check Labs Will DC Amiodarone in A.M. If No Further Arrhythmias. Discussed with His Wife regarding His Critical Condition.   LOS: 0 days    Rinaldo CloudHarwani, Abhishek Levesque 10/11/2015, 11:59 PM

## 2015-10-29 DIAGNOSIS — I213 ST elevation (STEMI) myocardial infarction of unspecified site: Secondary | ICD-10-CM

## 2015-10-29 LAB — POCT I-STAT 3, ART BLOOD GAS (G3+)
Acid-base deficit: 22 mmol/L — ABNORMAL HIGH (ref 0.0–2.0)
Acid-base deficit: 23 mmol/L — ABNORMAL HIGH (ref 0.0–2.0)
Bicarbonate: 5.7 mEq/L — ABNORMAL LOW (ref 20.0–24.0)
Bicarbonate: 5.9 mEq/L — ABNORMAL LOW (ref 20.0–24.0)
O2 Saturation: 100 %
O2 Saturation: 100 %
Patient temperature: 97
Patient temperature: 97.5
TCO2: 6 mmol/L (ref 0–100)
TCO2: 6 mmol/L (ref 0–100)
pCO2 arterial: 19.5 mmHg — CL (ref 35.0–45.0)
pCO2 arterial: 19.8 mmHg — CL (ref 35.0–45.0)
pH, Arterial: 7.061 — CL (ref 7.350–7.450)
pH, Arterial: 7.084 — CL (ref 7.350–7.450)
pO2, Arterial: 310 mmHg — ABNORMAL HIGH (ref 80.0–100.0)
pO2, Arterial: 313 mmHg — ABNORMAL HIGH (ref 80.0–100.0)

## 2015-10-29 LAB — BASIC METABOLIC PANEL
Anion gap: 29 — ABNORMAL HIGH (ref 5–15)
Anion gap: 30 — ABNORMAL HIGH (ref 5–15)
BUN: 24 mg/dL — ABNORMAL HIGH (ref 6–20)
BUN: 25 mg/dL — ABNORMAL HIGH (ref 6–20)
CALCIUM: 8 mg/dL — AB (ref 8.9–10.3)
CALCIUM: 8.4 mg/dL — AB (ref 8.9–10.3)
CO2: 10 mmol/L — AB (ref 22–32)
CO2: 9 mmol/L — AB (ref 22–32)
CREATININE: 2.4 mg/dL — AB (ref 0.61–1.24)
Chloride: 97 mmol/L — ABNORMAL LOW (ref 101–111)
Chloride: 99 mmol/L — ABNORMAL LOW (ref 101–111)
Creatinine, Ser: 2.32 mg/dL — ABNORMAL HIGH (ref 0.61–1.24)
GFR calc Af Amer: 33 mL/min — ABNORMAL LOW (ref >60)
GFR calc non Af Amer: 30 mL/min — ABNORMAL LOW (ref >60)
GFR, EST AFRICAN AMERICAN: 34 mL/min — AB (ref >60)
GFR, EST NON AFRICAN AMERICAN: 28 mL/min — AB (ref >60)
GLUCOSE: 143 mg/dL — AB (ref 65–99)
Glucose, Bld: 101 mg/dL — ABNORMAL HIGH (ref 65–99)
Potassium: 4.9 mmol/L (ref 3.5–5.1)
Potassium: 4.9 mmol/L (ref 3.5–5.1)
SODIUM: 137 mmol/L (ref 135–145)
Sodium: 137 mmol/L (ref 135–145)

## 2015-10-29 LAB — CBC
HCT: 29.8 % — ABNORMAL LOW (ref 39.0–52.0)
HEMATOCRIT: 27.4 % — AB (ref 39.0–52.0)
Hemoglobin: 8.5 g/dL — ABNORMAL LOW (ref 13.0–17.0)
Hemoglobin: 9.7 g/dL — ABNORMAL LOW (ref 13.0–17.0)
MCH: 27.3 pg (ref 26.0–34.0)
MCH: 28.1 pg (ref 26.0–34.0)
MCHC: 31 g/dL (ref 30.0–36.0)
MCHC: 32.6 g/dL (ref 30.0–36.0)
MCV: 86.4 fL (ref 78.0–100.0)
MCV: 88.1 fL (ref 78.0–100.0)
PLATELETS: 127 10*3/uL — AB (ref 150–400)
PLATELETS: 94 10*3/uL — AB (ref 150–400)
RBC: 3.11 MIL/uL — ABNORMAL LOW (ref 4.22–5.81)
RBC: 3.45 MIL/uL — ABNORMAL LOW (ref 4.22–5.81)
RDW: 14.7 % (ref 11.5–15.5)
RDW: 14.8 % (ref 11.5–15.5)
WBC: 20.8 10*3/uL — ABNORMAL HIGH (ref 4.0–10.5)
WBC: 23.1 10*3/uL — AB (ref 4.0–10.5)

## 2015-10-29 LAB — GLUCOSE, CAPILLARY
GLUCOSE-CAPILLARY: 92 mg/dL (ref 65–99)
GLUCOSE-CAPILLARY: 135 mg/dL — AB (ref 65–99)
Glucose-Capillary: 103 mg/dL — ABNORMAL HIGH (ref 65–99)

## 2015-10-29 LAB — POCT I-STAT, CHEM 8
BUN: 32 mg/dL — ABNORMAL HIGH (ref 6–20)
Calcium, Ion: 0.93 mmol/L — ABNORMAL LOW (ref 1.12–1.23)
Chloride: 95 mmol/L — ABNORMAL LOW (ref 101–111)
Creatinine, Ser: 2 mg/dL — ABNORMAL HIGH (ref 0.61–1.24)
Glucose, Bld: 119 mg/dL — ABNORMAL HIGH (ref 65–99)
HCT: 24 % — ABNORMAL LOW (ref 39.0–52.0)
Hemoglobin: 8.2 g/dL — ABNORMAL LOW (ref 13.0–17.0)
Potassium: 5 mmol/L (ref 3.5–5.1)
Sodium: 138 mmol/L (ref 135–145)
TCO2: 18 mmol/L (ref 0–100)

## 2015-10-29 LAB — MAGNESIUM
MAGNESIUM: 2.7 mg/dL — AB (ref 1.7–2.4)
Magnesium: 2.8 mg/dL — ABNORMAL HIGH (ref 1.7–2.4)

## 2015-10-29 LAB — TROPONIN I: Troponin I: 32.98 ng/mL (ref <0.031)

## 2015-10-29 LAB — ABO/RH: ABO/RH(D): A POS

## 2015-10-29 MED ORDER — SODIUM BICARBONATE 8.4 % IV SOLN
INTRAVENOUS | Status: DC
Start: 2015-10-29 — End: 2015-10-29
  Filled 2015-10-29: qty 200

## 2015-10-29 MED ORDER — SODIUM BICARBONATE 8.4 % IV SOLN
INTRAVENOUS | Status: AC
  Filled 2015-10-29: qty 100

## 2015-10-29 MED ORDER — EPINEPHRINE HCL 0.1 MG/ML IJ SOSY
PREFILLED_SYRINGE | INTRAMUSCULAR | Status: DC
Start: 2015-10-29 — End: 2015-10-29
  Filled 2015-10-29: qty 50

## 2015-10-29 MED ORDER — EPINEPHRINE HCL 0.1 MG/ML IJ SOSY
PREFILLED_SYRINGE | INTRAMUSCULAR | Status: AC
  Filled 2015-10-29: qty 50

## 2015-10-29 MED ORDER — MIDAZOLAM HCL 2 MG/2ML IJ SOLN
INTRAMUSCULAR | Status: AC
  Filled 2015-10-29: qty 4

## 2015-10-29 MED ORDER — LACTATED RINGERS IV BOLUS (SEPSIS): 1000.0000 mL | Freq: Once | INTRAVENOUS | Status: DC

## 2015-10-29 MED ORDER — EPINEPHRINE HCL 1 MG/ML IJ SOLN
0.5000 ug/min | INTRAVENOUS | Status: DC
  Filled 2015-10-29: qty 4

## 2015-10-29 MED ORDER — STERILE WATER FOR INJECTION IV SOLN
INTRAVENOUS | Status: DC
  Filled 2015-10-29 (×4): qty 850

## 2015-10-29 MED FILL — Medication: Qty: 1 | Status: AC

## 2015-10-30 ENCOUNTER — Encounter (HOSPITAL_COMMUNITY): Payer: Self-pay | Admitting: Cardiology

## 2015-10-30 LAB — POCT ACTIVATED CLOTTING TIME
Activated Clotting Time: 368 seconds
Activated Clotting Time: 358 seconds

## 2015-10-30 LAB — TYPE AND SCREEN
ABO/RH(D): A POS
Antibody Screen: NEGATIVE
Unit division: 0
Unit division: 0

## 2015-10-30 LAB — HEMOGLOBIN A1C
Hgb A1c MFr Bld: 6.3 % — ABNORMAL HIGH (ref 4.8–5.6)
MEAN PLASMA GLUCOSE: 134 mg/dL

## 2015-10-31 LAB — POCT I-STAT 3, ART BLOOD GAS (G3+)
ACID-BASE DEFICIT: 16 mmol/L — AB (ref 0.0–2.0)
Bicarbonate: 11 mEq/L — ABNORMAL LOW (ref 20.0–24.0)
O2 Saturation: 68 %
PCO2 ART: 27 mmHg — AB (ref 35.0–45.0)
PH ART: 7.213 — AB (ref 7.350–7.450)
PO2 ART: 40 mmHg — AB (ref 80.0–100.0)
Patient temperature: 97
TCO2: 12 mmol/L (ref 0–100)

## 2015-10-31 LAB — POCT I-STAT, CHEM 8
BUN: 30 mg/dL — AB (ref 6–20)
Calcium, Ion: 0.97 mmol/L — ABNORMAL LOW (ref 1.12–1.23)
Chloride: 94 mmol/L — ABNORMAL LOW (ref 101–111)
Creatinine, Ser: 1.8 mg/dL — ABNORMAL HIGH (ref 0.61–1.24)
Glucose, Bld: 135 mg/dL — ABNORMAL HIGH (ref 65–99)
HEMATOCRIT: 17 % — AB (ref 39.0–52.0)
HEMOGLOBIN: 5.8 g/dL — AB (ref 13.0–17.0)
POTASSIUM: 4.4 mmol/L (ref 3.5–5.1)
SODIUM: 138 mmol/L (ref 135–145)
TCO2: 15 mmol/L (ref 0–100)

## 2015-11-04 NOTE — Procedures (Signed)
CENTRAL VENOUS CATHETER INSERTION   Indication: Shock Consent: yes Time out: yes Appropriate position: yes Hand washing: yes Patient Sterilized and Draped: yes Location: Right IJ # of Attempts: 1 Ultrasound Guidance: yes Wire Confirmed with US: yes Insertion depth: 20 cm All Ports Draw and flush: yes CXR:   Pneumothorax: + lung sliding present on bedside ultrasound  Line position appropriate: yes Line sutured in place: yes EBL: 5 cc Complications: no Patient Tolerated Procedure Well: yes     Luis Profferaniel Merik Mignano, DO., MS [facility]

## 2015-11-04 NOTE — Plan of Care (Signed)
CODE BLUE NOTE  Called to the bedside for patient in shock to place central line.  Right sided IJ CVC was placed without complication. He was in shock with bradycardia and low EF on bedside TTE, his pacer rate was increased to 80 and mV increased to 10 to allow for capture.  Prior to CXR pulses lost (PEA arrest), compressions started at 01:13, ABG demonstrated metabolic acidemia.  Bedside US demonstrated no evidence of pneumothorax or pericardial effusion.  CPR was continued for approximated 50min with end tidal CO2 >10.  Numerous pushes of epinephrine and bicarb were delivered as well as calcium gluconate, intravenous fluid and finally an epinephrine gtt (please see code blue sheet for all numbers and doses).  During the code black NGT output was noted >1L, ISTAT Hb was 8 but emergency blood (2 unites) was activated, warmed and given via the LEVEL 1 infuser.  ROSC was achieved 3 times and bedside TTE was performed all with severe global hypokinesis (LVEF <5%) milrinone was started.  The attending cardiologist came to the bedside.  Electrolytes, pH and body temp were corrected.  The family was at bedside for 30min, at 0200 with the agreement of the family, cardiology and all bedside staff, it was determined that no reversible cause was evident and the patient was pronounced dead @ 0203.  The family requested an autopsy.   Galvin Profferaniel Darrin Koman, DO., MS [facility]

## 2015-11-04 NOTE — Progress Notes (Signed)
Chaplain provided emotional and spiritual care to family in crisis during CPR in progress of patient whose wife and daughter were in the room.  Offered prayer, refreshments and logistical support.  Following the pronouncement of death, provided escort to incoming family members and grief support to a range of family members including patient's minor sons and nephew.  Offered ongoing support as desired or needed.    Theodoro Parmaalacios, Shaaron Golliday N, Chaplain [phone]     10/11/2015 0229  Clinical Encounter Type  Visited With Family  Visit Type Spiritual support;Initial;Critical Care  Referral From Nurse  Consult/Referral To Chaplain  Spiritual Encounters  Spiritual Needs Grief support  Stress Factors  Family Stress Factors Major life changes;Lack of knowledge

## 2015-11-04 NOTE — Procedures (Signed)
Arterial Catheter Insertion Procedure Note Luis StarchLorenza [name] [id number] 01-Mar-1956  Procedure: Insertion of Arterial Catheter  Indications: Blood pressure monitoring and Frequent blood sampling  Procedure Details Consent: Risks of procedure as well as the alternatives and risks of each were explained to the (patient/caregiver).  Consent for procedure obtained. and Unable to obtain consent because of emergent medical necessity. Time Out: Verified patient identification, verified procedure, site/side was marked, verified correct patient position, special equipment/implants available, medications/allergies/relevent history reviewed, required imaging and test results available.  Performed  Maximum sterile technique was used including antiseptics, cap, gloves, gown, hand hygiene, mask and sheet. Skin prep: Chlorhexidine; local anesthetic administered 20 gauge catheter was inserted into left radial artery using the Seldinger technique.  Evaluation Blood flow poor; BP tracing poor. Complications: No apparent complications.   Leafy HalfSnider, Jesica Goheen Dale 10/09/2015  as

## 2015-11-04 DEATH — deceased

## 2015-12-05 NOTE — Discharge Summary (Signed)
[name], [name] NO.:  [id number]  MEDICAL RECORD NO.:  [id number]  LOCATION:  2H01C                        FACILITY:  [facility]  PHYSICIAN:  Okey Zelek N. Sharyn Lull, M.D. DATE OF BIRTH:  December 25, 1955  DATE OF ADMISSION:  11-23-2015 DATE OF DISCHARGE:  10/07/2015                              DISCHARGE SUMMARY   DATE OF EXPIRY:  October 29, 2015.  ADMITTING DIAGNOSES: 1. Recent inferoposterior wall myocardial infarction. 2. Post infarction angina. 3. Coronary artery disease, history of myocardial infarction in the     past status post PCI to diagonal 1 and PLV branch of RCA in the     past. 4. Hypotensive shock. 5. Diabetes mellitus. 6. Tobacco abuse. 7. Gastroesophageal reflux disease. 8. Peripheral vascular disease. 9. Positive family history of coronary artery disease. 10.Degenerative joint disease.  FINAL DIAGNOSES: 1. Cardiogenic shock secondary to large inferoposterior wall recent     myocardial infarction, status post complete heart block, status     post temporary pacemaker. 2. Acute respiratory failure. 3. Metabolic acidosis. 4. Multivessel coronary artery disease. 5. Ischedmic cardiomyopathy. 6. Hypertension. 7. Diabetes mellitus. 8. Acute renal injury secondary to contrast/hypertension. 9. Acute anemia secondary to gastrointestinal blood loss and also     blood loss during PCI. 10.Tobacco abuse. 11.Gastroesophageal reflux disease. 12.Peripheral vascular disease. 13.Degenerative joint disease. 14.Positive family history of coronary artery disease.  BRIEF DEATH SUMMARY:  The patient was a 60 year old male with past medical history significant for coronary artery disease, history of MI in the past, status post PTCA stenting to 100% occluded diagonal 2 in the past and PLV branch of the RCA in the past, hypertension, diabetes mellitus, history of congestive heart failure secondary to depressed LV systolic function, tobacco abuse, GERD,  degenerative joint disease., positive family history of coronary artery disease, peripheral vascular disease, who was transferred from [facility] as code STEMI was called.  The patient complained of left-sided vague chest discomfort associated with epigastric pain and progressive increasing shortness of breath associated with feeling weak, malaise.  No appetite for the last 4 days.  The patient did not seek any medical attention.  As his symptoms got worse, so decided to come to ED.  EKG done in the ED showed normal sinus rhythm with Q-waves and ST elevation in inferior leads and ST depression in V1 to V3, and reciprocal changes in lead 1 and aVL, suggestive of acute inferoposterior wall myocardial infarction.  The patient was noted to have elevated troponin I of 66.  The patient was transferred to emergently to [facility] for emergency PCI.  PHYSICAL EXAMINATION:  VITAL SIGNS:  His blood pressure was 99/71, pulse 50. HEENT:  Conjunctivae were normal. NECK:  Supple.  No JVD. LUNGS:  Clear anterolaterally. CARDIOVASCULAR:  Regular rate and rhythm.  Soft systolic murmur and S4 gallop. ABDOMEN:  Soft.  Bowel sounds were present. EXTREMITIES:  There was no clubbing, cyanosis, or edema.  LABORATORY DATA:  Sodium was 129, potassium 3.9, BUN 16, creatinine 1.21.  Glucose was 171.  His troponin I first set was 66.56.  Hemoglobin was 11.6, hematocrit 34, white count of 16.1.  Chest x-ray showed no focal consolidation.  Repeat  labs on January 25; sodium was 130, potassium 4.6, BUN 16, creatinine 1.43.  Hemoglobin 10.1, hematocrit 29.3, white count of 18.6.  Troponin I was trending down to 30.64. Repeat ABGs; pH 6.89, pCO2 was 21, O2 was 99.  His sodium was 137, potassium 4.9, BUN 25, creatinine 2.32.  Hemoglobin 8.5, hematocrit 27.4, white count of 23.1.  BRIEF HOSPITAL COURSE:  The patient was emergently taken to the cath lab and underwent left cardiac cath as per  procedure report.  The patient was noted to have 100% occluded RCA.  Attempted to do PCI and multiple stents to PCA without success including aspiration thrombectomy.  The patient had large thrombus burden and RT appears to be occluded for last few days.  The patient also had moderate stenosis in LAD and moderate-to- severe stenosis in proximal LAD and moderate stenosis in the obtuse marginal as per procedure report.  Post procedure, the patient had episode of complete heart block.  The patient was taken back to the lab and temporary transvenous pacemaker was placed without difficulty.  The patient was transferred back to CCU and on December 25 the patient was found unresponsive in the room, hypotensive with occasional non capture on the monitor.  Noted to have marked acidosis, requiring intubation. The patient was also noted to have frequent PVCs, requiring IV amiodarone.  CCM consultation was obtained.  The patient was also subsequently intubated.  The patient remained hypotensive on 2 IV pressors, and subsequently during insertion of central line by CCM the patient was noted to be hypotensive, bradycardiac.  His pacemaker rate was adjusted and mA was increased to 10 without any capture.  The patient was noted to be in PEA.  CPR was started and ACLS protocol was followed for approximately 50 minutes.  The patient had bedside transthoracic echo done which showed EF of approximately less than 5%. Discussed with the patient's family about his critical condition and finally code was called off and the patient was pronounced dead at 2:03 a.m. on Nov 10, 2023.     [name]. Sharyn Lull, M.D.     [facility]/MED[facility]  D:  11/29/2015  T:  11/29/2015  Job:  [id number]

## 2017-05-06 IMAGING — DX DG CHEST 2V
2 series · 2 of 2 positions shown · non-contrast
Comparison: 01/10/2011

CLINICAL DATA: Chest pain and shortness of Breath

EXAM:
CHEST - 2 VIEW

[chest pa]
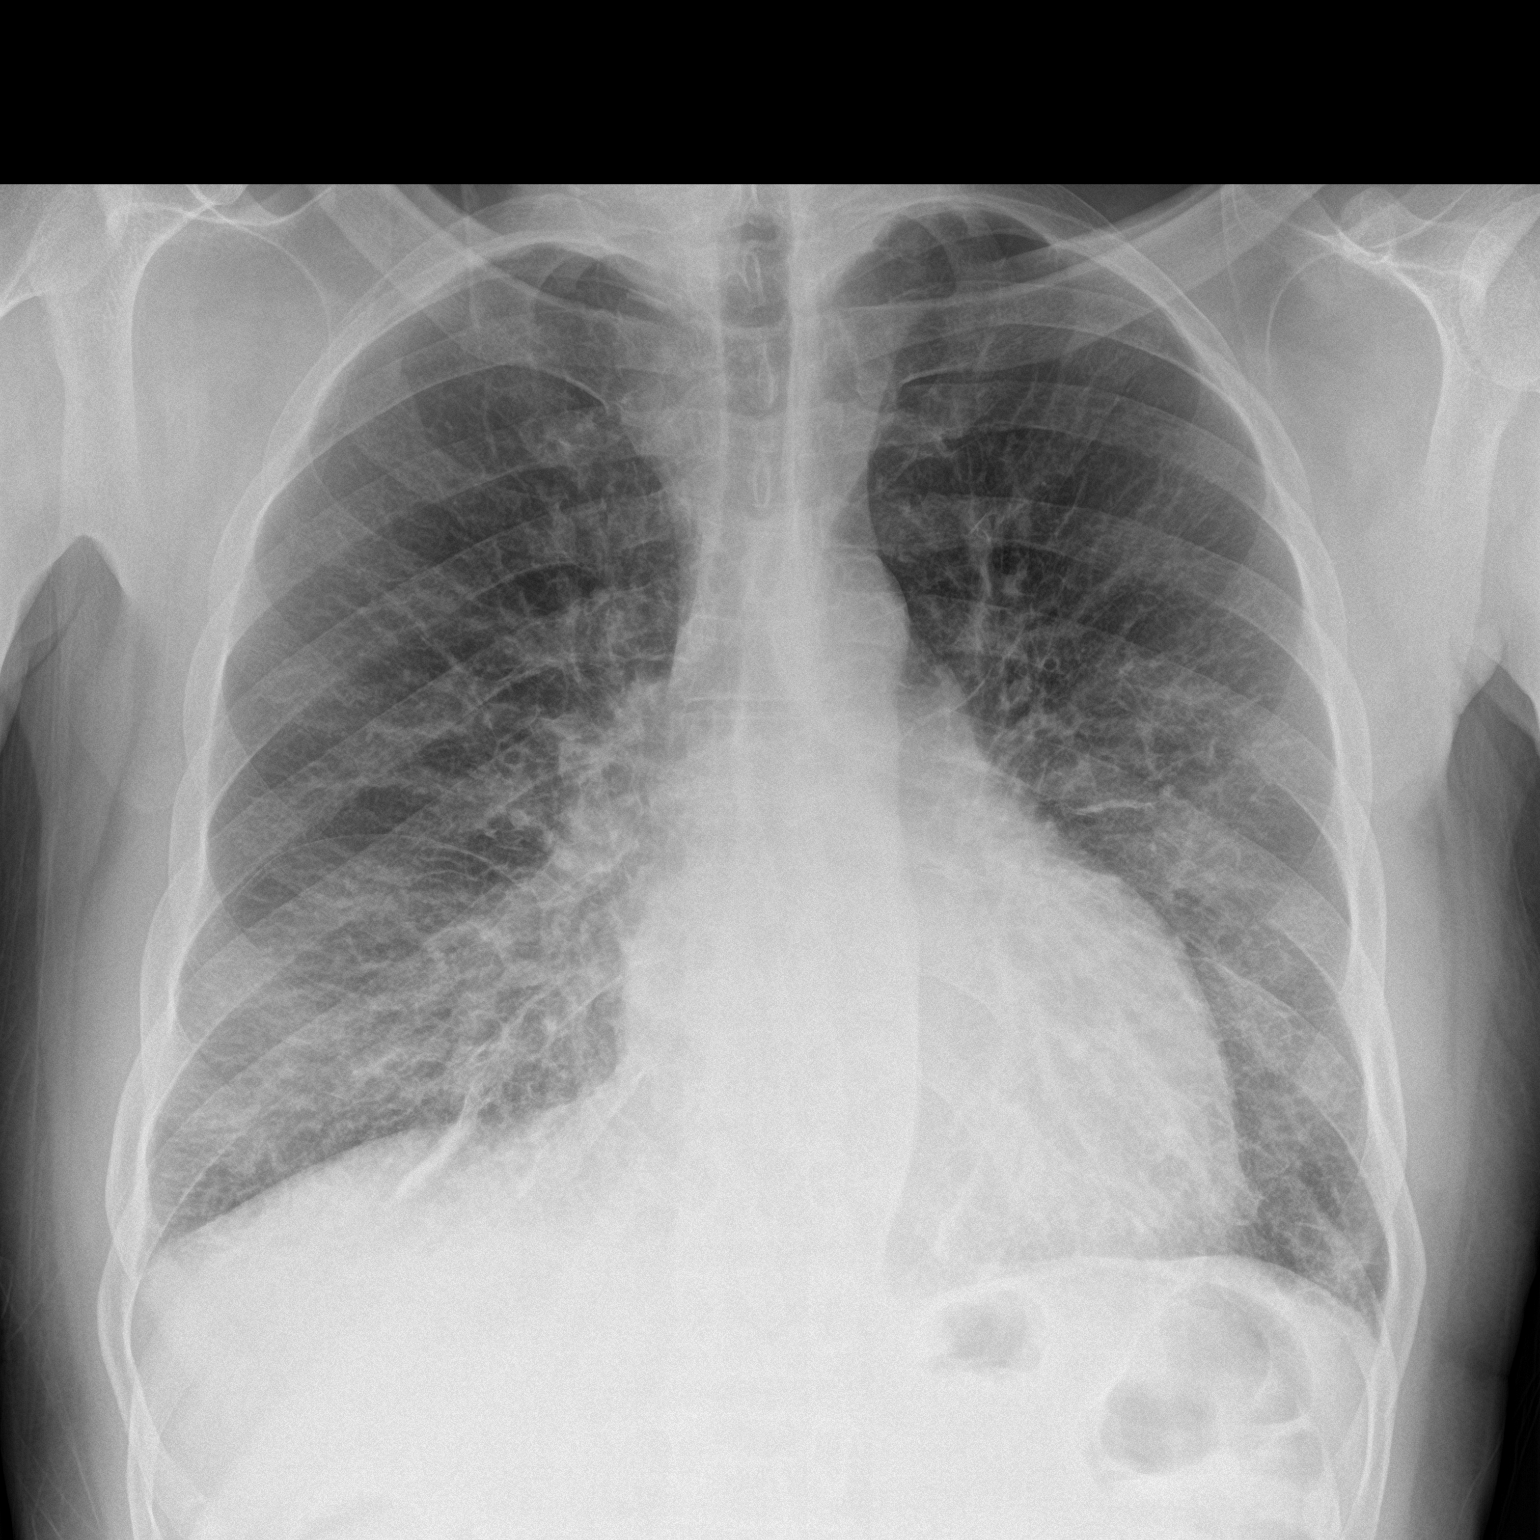

[chest lat]
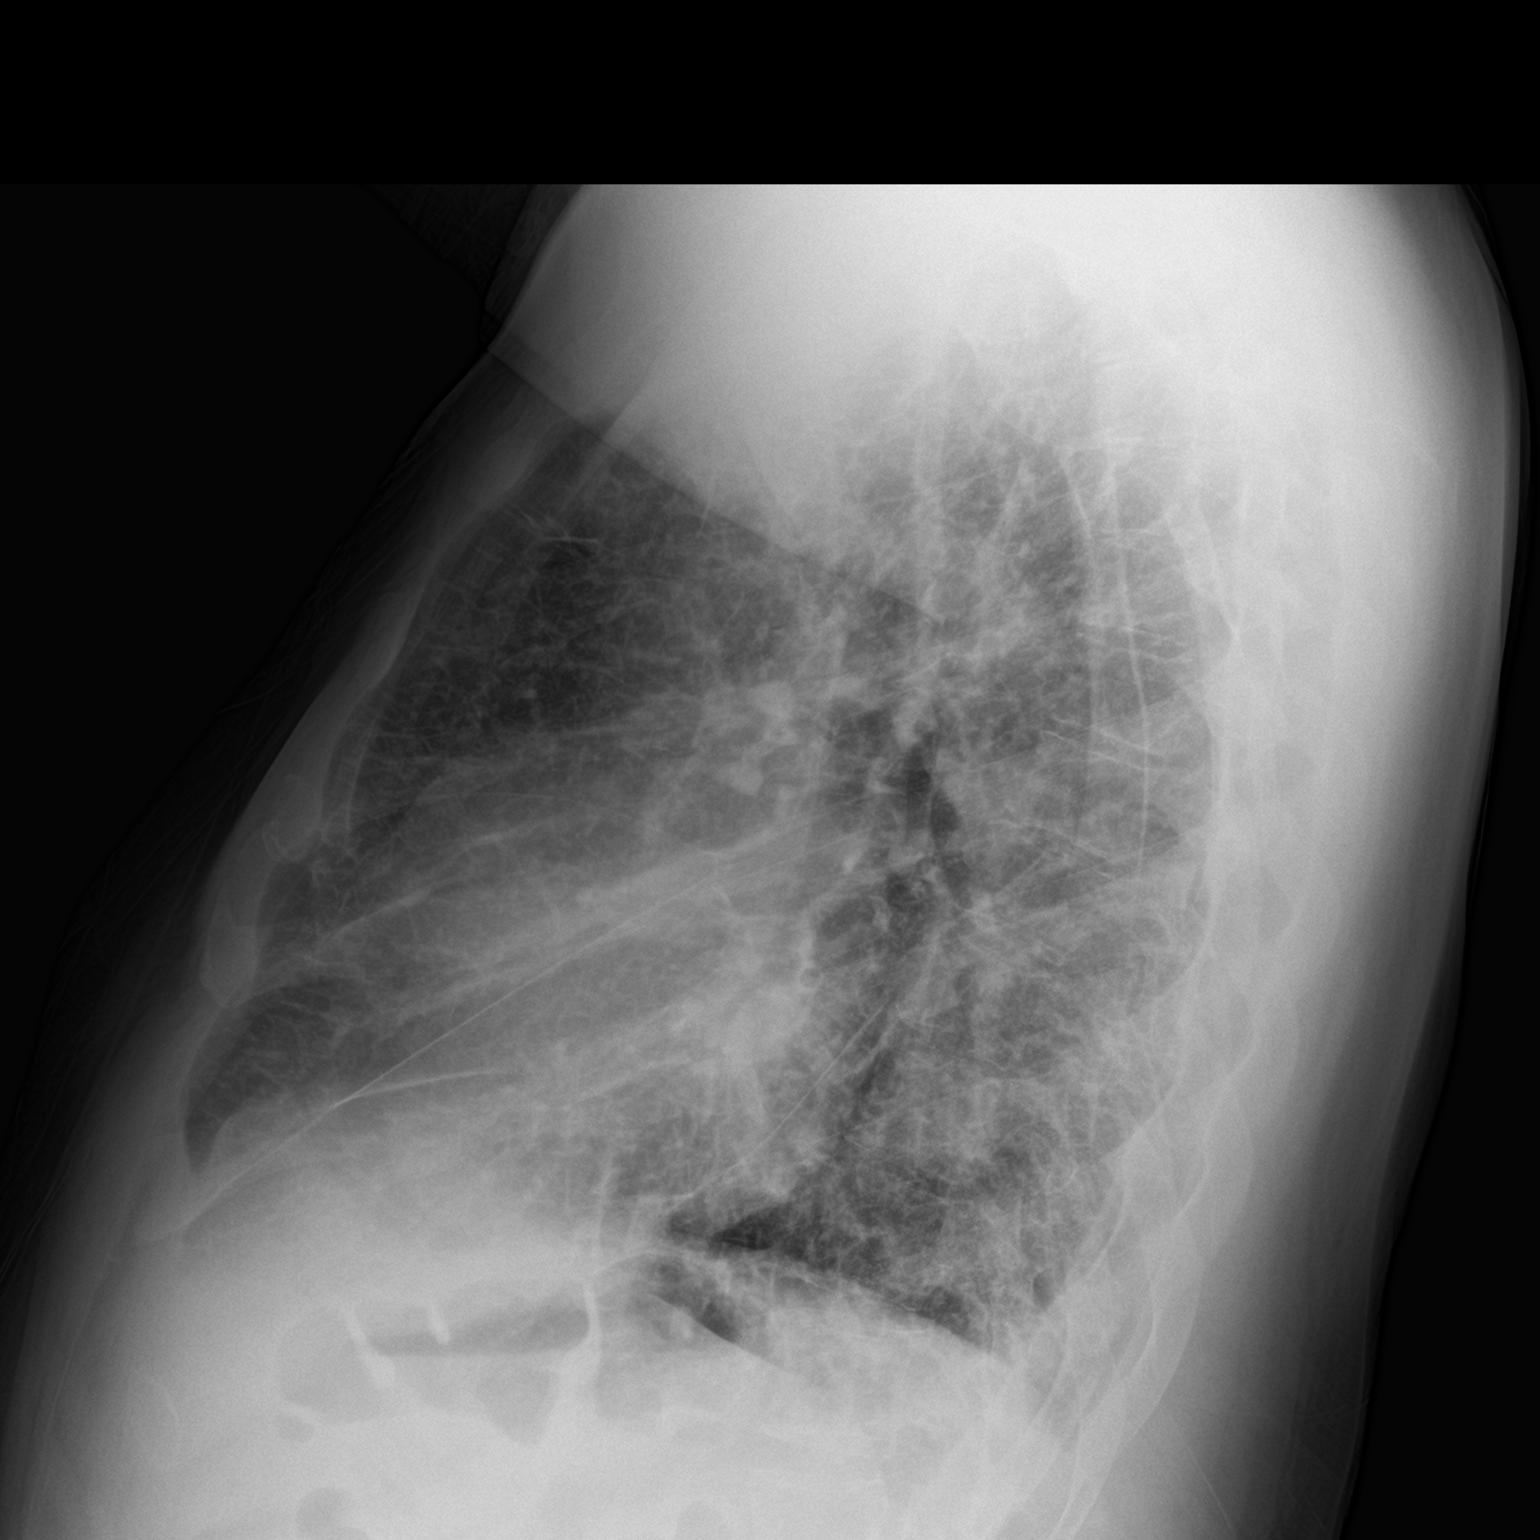

[2 of 2 positions shown; findings below may reference images not displayed]

FINDINGS: Cardiac shadow is at the upper limits of normal in size. Diffuse
interstitial changes are noted with evidence of interstitial edema.
No focal confluent infiltrate is seen. No sizable effusion is noted.
IMPRESSION: Mild CHF with interstitial edema.

## 2017-07-13 IMAGING — CR DG CHEST 1V PORT
1 series · 1 of 1 positions shown · non-contrast
Comparison: Chest radiograph performed earlier today at [DATE] a.m.

CLINICAL DATA: Acute onset of respiratory failure and hypoxia.
Endotracheal tube placement. Initial encounter.

EXAM:
PORTABLE CHEST 1 VIEW

[AP]
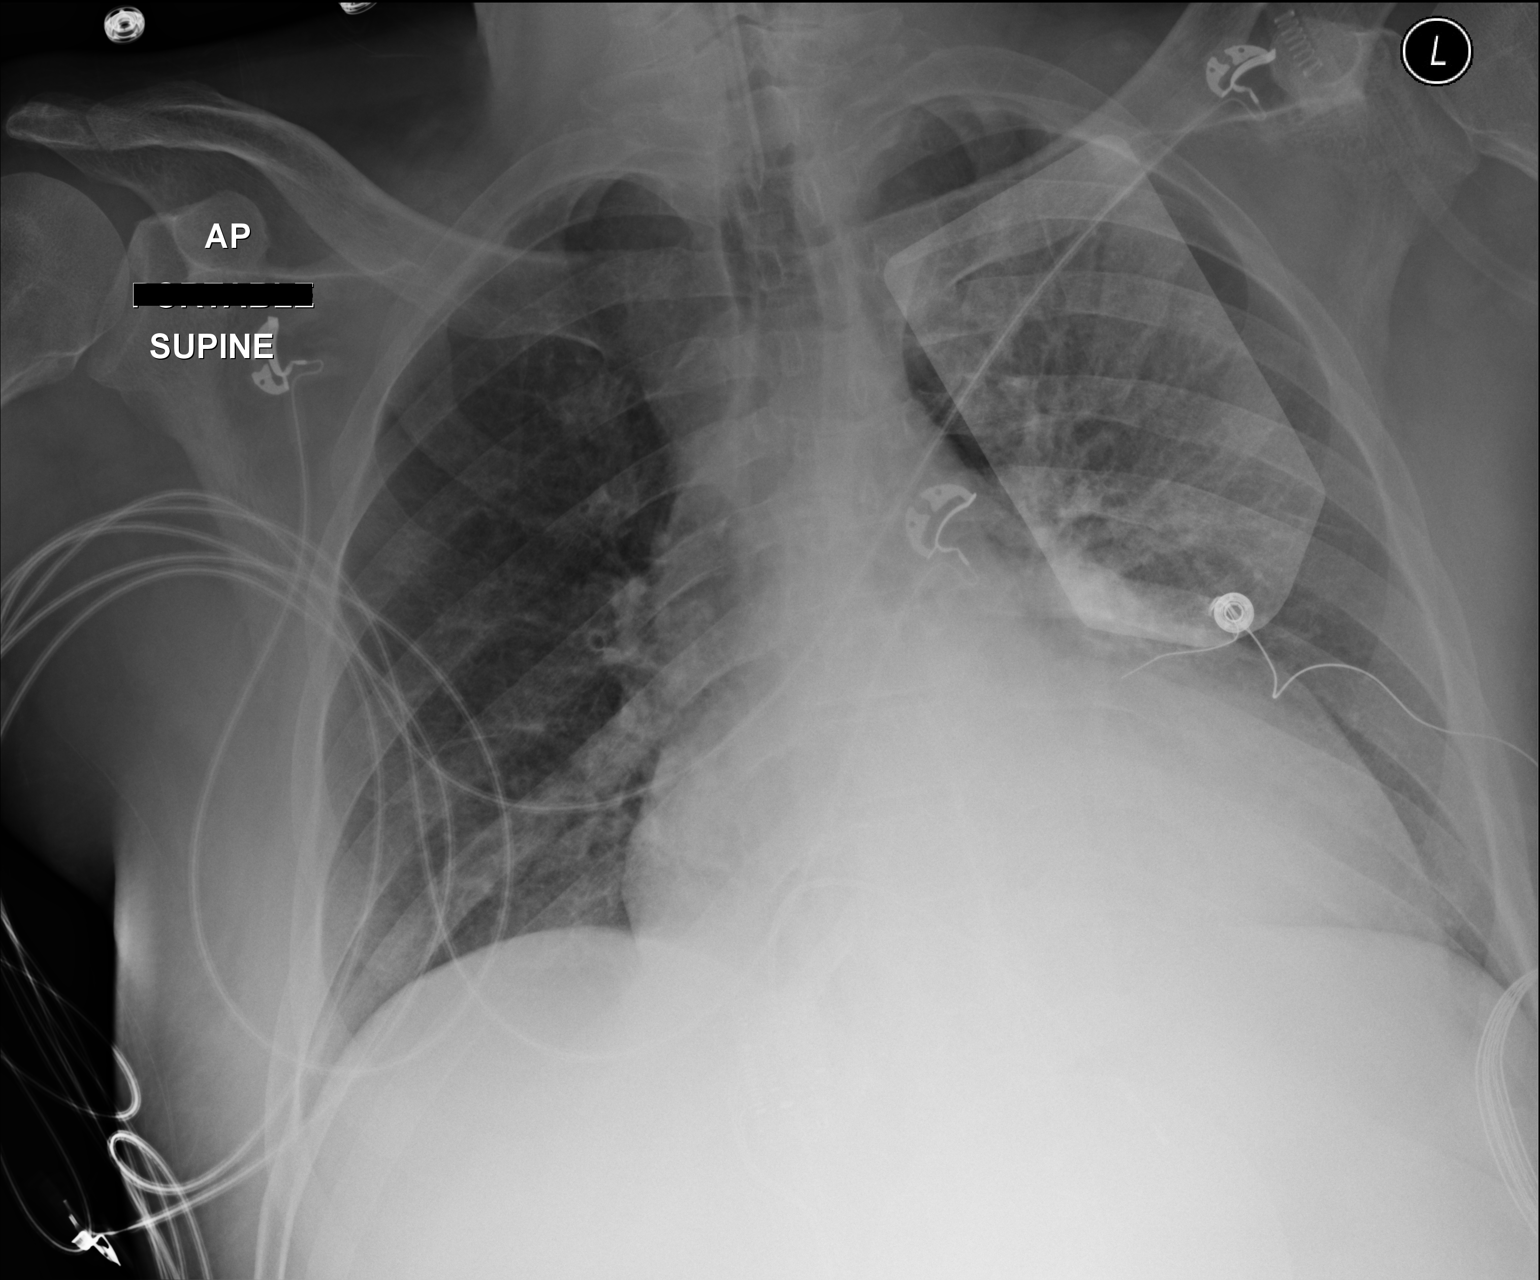

[1 of 1 positions shown; findings below may reference images not displayed]

FINDINGS: The patient's endotracheal tube is seen ending 9 cm above the
carina. This could be advanced approximately 6 cm.

The lungs appear relatively clear. No pleural effusion or
pneumothorax is seen.

The cardiomediastinal silhouette is enlarged. No acute osseous
abnormalities are identified. An external pacing pad is noted.
IMPRESSION: 1. Endotracheal tube seen ending 9 cm above the carina. This could
be advanced approximately 6 cm.
2. Lungs relatively clear.
3. Cardiomegaly noted.

These results were called by telephone at the time of interpretation
on 10/28/2015 at [DATE] to [REDACTED] on [facility], who verbally
acknowledged these results.
